# Patient Record
Sex: Female | Born: 1990 | Race: Black or African American | Hispanic: No | Marital: Married | Smoking: Former smoker
Health system: Southern US, Community
[De-identification: ages and names within clinical notes are randomized; demographics above are authoritative.]

## PROBLEM LIST (undated history)

## (undated) ENCOUNTER — Inpatient Hospital Stay (HOSPITAL_COMMUNITY): Payer: Self-pay

## (undated) DIAGNOSIS — D573 Sickle-cell trait: Secondary | ICD-10-CM

## (undated) DIAGNOSIS — O479 False labor, unspecified: Secondary | ICD-10-CM

## (undated) DIAGNOSIS — L03317 Cellulitis of buttock: Secondary | ICD-10-CM

## (undated) DIAGNOSIS — L0231 Cutaneous abscess of buttock: Secondary | ICD-10-CM

## (undated) DIAGNOSIS — L0591 Pilonidal cyst without abscess: Secondary | ICD-10-CM

## (undated) DIAGNOSIS — D649 Anemia, unspecified: Secondary | ICD-10-CM

## (undated) HISTORY — DX: Sickle-cell trait: D57.3

## (undated) HISTORY — DX: Cellulitis of buttock: L03.317

## (undated) HISTORY — DX: Pilonidal cyst without abscess: L05.91

## (undated) HISTORY — DX: Anemia, unspecified: D64.9

## (undated) HISTORY — PX: NO PAST SURGERIES: SHX2092

## (undated) HISTORY — DX: Cutaneous abscess of buttock: L02.31

---

## 2013-03-12 ENCOUNTER — Encounter (INDEPENDENT_AMBULATORY_CARE_PROVIDER_SITE_OTHER): Payer: Self-pay | Admitting: Surgery

## 2013-03-12 ENCOUNTER — Encounter (INDEPENDENT_AMBULATORY_CARE_PROVIDER_SITE_OTHER): Payer: Self-pay | Admitting: General Surgery

## 2013-03-12 ENCOUNTER — Other Ambulatory Visit: Payer: Self-pay | Admitting: Family Medicine

## 2013-03-12 ENCOUNTER — Other Ambulatory Visit (HOSPITAL_COMMUNITY)
Admission: RE | Admit: 2013-03-12 | Discharge: 2013-03-12 | Disposition: A | Discharge status: Home / Self Care | Payer: BC Managed Care – PPO | Source: Ambulatory Visit | Attending: Family Medicine | Admitting: Family Medicine

## 2013-03-12 ENCOUNTER — Ambulatory Visit (INDEPENDENT_AMBULATORY_CARE_PROVIDER_SITE_OTHER): Payer: BC Managed Care – PPO | Admitting: Surgery

## 2013-03-12 VITALS — BP 128/72 | HR 98 | Temp 98.0°F | Resp 18 | Ht 64.25 in | Wt 193.0 lb

## 2013-03-12 DIAGNOSIS — L0231 Cutaneous abscess of buttock: Secondary | ICD-10-CM

## 2013-03-12 DIAGNOSIS — Z124 Encounter for screening for malignant neoplasm of cervix: Secondary | ICD-10-CM | POA: Insufficient documentation

## 2013-03-12 DIAGNOSIS — L03317 Cellulitis of buttock: Secondary | ICD-10-CM

## 2013-03-12 HISTORY — DX: Cutaneous abscess of buttock: L03.317

## 2013-03-12 HISTORY — DX: Cutaneous abscess of buttock: L02.31

## 2013-03-12 MED ORDER — HYDROCODONE-ACETAMINOPHEN 5-325 MG PO TABS: 1.0000 | ORAL_TABLET | ORAL | Status: DC | PRN

## 2013-03-12 NOTE — Progress Notes (Signed)
Patient ID: Mckenzie Nelson, female   DOB: [birth year], 22 y.o.   MRN: [id number]  Chief Complaint  Patient presents with  . rt buttock abscess    HPI Mckenzie Nelson is a 22 y.o. female.  Referred by Dr. Maurice Small [facility] for evaluation of right buttock abscess  HPI This is a 22 year old female who presents with a five-year history of a mass in her right buttock. Occasionally it opens up and drains some purulent fluid. Sometimes it causes pain. Over the last couple of weeks she has had increasing tenderness and swelling as well as bloody drainage from this area. She was evaluated in her primary care physician's office today and is now referred for urgent office for evaluation. The patient has never had any surgery in this area.  History reviewed. No pertinent past medical history.  History reviewed. No pertinent past surgical history.  History reviewed. No pertinent family history.  Social History History  Substance Use Topics  . Smoking status: Former Games developer  . Smokeless tobacco: Not on file  . Alcohol Use: No    No Known Allergies  Current Outpatient Prescriptions  Medication Sig Dispense Refill  . HYDROcodone-acetaminophen (NORCO/VICODIN) 5-325 MG per tablet Take 1 tablet by mouth every 4 (four) hours as needed.  40 tablet  0   No current facility-administered medications for this visit.    Review of Systems Review of Systems  Constitutional: Negative for fever, chills and unexpected weight change.  HENT: Negative for congestion, hearing loss, sore throat, trouble swallowing and voice change.   Eyes: Negative for visual disturbance.  Respiratory: Negative for cough and wheezing.   Cardiovascular: Negative for chest pain, palpitations and leg swelling.  Gastrointestinal: Negative for nausea, vomiting, abdominal pain, diarrhea, constipation, blood in stool, abdominal distention and anal bleeding.  Genitourinary: Negative for hematuria, vaginal bleeding and difficulty  urinating.  Musculoskeletal: Negative for arthralgias.  Skin: Positive for wound. Negative for rash.  Neurological: Negative for seizures, syncope and headaches.  Hematological: Negative for adenopathy. Does not bruise/bleed easily.  Psychiatric/Behavioral: Negative for confusion.    Blood pressure 128/72, pulse 98, temperature 98 F (36.7 C), resp. rate 18, height 5' 4.25" (1.632 m), weight 193 lb (87.544 kg).    Physical Exam Physical Exam WDWN in NAD On her right buttocks medially there is a 2 cm area of hard induration. There is no overlying erythema. There is a small punctate opening that is draining some purulent fluid. 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 We prepped this area with Betadine and anesthetized with 1% lidocaine. I made a elliptical incision around the old drainage tract. We dissected down into a 2 cm diameter abscess cavity. There some old granulation tissue within this cavity. We packed the wound with quarter-inch Nu Gauze. The patient will come back later this week for dressing change in the arch in the office. I did give her one prescription for Vicodin for pain.  Mckenzie Arms. Corliss Skains, MD, [facility]  General/ Trauma Surgery  03/12/2013 4:54 PM         Zacheriah Stumpe K. 03/12/2013, 4:48 PM

## 2013-03-15 ENCOUNTER — Encounter (INDEPENDENT_AMBULATORY_CARE_PROVIDER_SITE_OTHER): Payer: Self-pay | Admitting: General Surgery

## 2013-03-15 ENCOUNTER — Ambulatory Visit (INDEPENDENT_AMBULATORY_CARE_PROVIDER_SITE_OTHER): Payer: BC Managed Care – PPO | Admitting: General Surgery

## 2013-03-15 VITALS — BP 128/86 | HR 68 | Temp 98.0°F | Resp 14 | Ht 64.2 in | Wt 193.0 lb

## 2013-03-15 DIAGNOSIS — Z09 Encounter for follow-up examination after completed treatment for conditions other than malignant neoplasm: Secondary | ICD-10-CM

## 2013-03-15 NOTE — Patient Instructions (Signed)
Expect some ongoing intermittent drainage. Wear a pad in her underwear or cover with dry gauze. May shower. Call for recurrent symptoms or if have additional questions

## 2013-03-15 NOTE — Progress Notes (Signed)
Subjective:     Patient ID: Mckenzie Nelson, female   DOB: 1991-02-09, 22 y.o.   MRN: [id number]  HPI 22 year old African American female came in 3 days ago with an infected chronic abscess of the medial right buttock. Dr. Corliss Skains did a small incision and drainage in the office and left packing. She states that she is doing well. She states that she is having to change the outer gauze about twice a day due to drainage. She denies any fevers or chills. She denies any difficulty urinating  Review of Systems     Objective:   Physical Exam BP 128/86  Pulse 68  Temp(Src) 98 F (36.7 C)  Resp 14  Ht 5' 4.2" (1.631 m)  Wt 193 lb (87.544 kg)  BMI 32.91 kg/m2 Alert, no apparent distress Buttocks - upper medial portion of right buttock demonstrates a 1-1/2 cm opening with a small amount of packing. There is no saline is. I cannot express any drainage. Packing strip was removed. There is about a dime-sized amount of induration medial toward the gluteal cleft. In the gluteal cleft there appears to be a small sinus may be consistent with a pilonidal sinus    Assessment:     Status post incision and drainage of the chronic right medial buttock abscess     Plan:     The area appears to be healing well. There is only some minimal induration left. I told the patient she no longer need to pack the wound. I told her to expect some ongoing intermittent drainage. I explained that the residual induration should continue to improve. I recommended that once the area has completely healed that she consider keeping the gluteal cleft free of hair in case this is a pilonidal sinus. F/u PRN  Mary Sella. Andrey Campanile, MD, FACS General, Bariatric, & Minimally Invasive Surgery [facility], [place]

## 2013-04-15 ENCOUNTER — Other Ambulatory Visit: Payer: Self-pay | Admitting: Family Medicine

## 2013-04-15 ENCOUNTER — Other Ambulatory Visit (HOSPITAL_COMMUNITY)
Admission: RE | Admit: 2013-04-15 | Discharge: 2013-04-15 | Disposition: A | Discharge status: Home / Self Care | Payer: BC Managed Care – PPO | Source: Ambulatory Visit | Attending: Family Medicine | Admitting: Family Medicine

## 2013-04-15 DIAGNOSIS — R87615 Unsatisfactory cytologic smear of cervix: Secondary | ICD-10-CM | POA: Insufficient documentation

## 2013-05-15 ENCOUNTER — Emergency Department (INDEPENDENT_AMBULATORY_CARE_PROVIDER_SITE_OTHER)
Admission: EM | Admit: 2013-05-15 | Discharge: 2013-05-15 | Disposition: A | Discharge status: Home / Self Care | Payer: BC Managed Care – PPO | Source: Home / Self Care

## 2013-05-15 ENCOUNTER — Encounter (HOSPITAL_COMMUNITY): Payer: Self-pay | Admitting: Emergency Medicine

## 2013-05-15 DIAGNOSIS — J069 Acute upper respiratory infection, unspecified: Secondary | ICD-10-CM

## 2013-05-15 DIAGNOSIS — R0982 Postnasal drip: Secondary | ICD-10-CM

## 2013-05-15 NOTE — Discharge Instructions (Signed)
Pharyngitis For drainage allegra or claritin. For stronger med that may cause drowsiness Chlor-Trimeton. Cepacol Lozenges Ibuprofen Pharyngitis is redness, pain, and swelling (inflammation) of your pharynx.  CAUSES  Pharyngitis is usually caused by infection. Most of the time, these infections are from viruses (viral) and are part of a cold. However, sometimes pharyngitis is caused by bacteria (bacterial). Pharyngitis can also be caused by allergies. Viral pharyngitis may be spread from person to person by coughing, sneezing, and personal items or utensils (cups, forks, spoons, toothbrushes). Bacterial pharyngitis may be spread from person to person by more intimate contact, such as kissing.  SIGNS AND SYMPTOMS  Symptoms of pharyngitis include:   Sore throat.   Tiredness (fatigue).   Low-grade fever.   Headache.  Joint pain and muscle aches.  Skin rashes.  Swollen lymph nodes.  Plaque-like film on throat or tonsils (often seen with bacterial pharyngitis). DIAGNOSIS  Your health care provider will ask you questions about your illness and your symptoms. Your medical history, along with a physical exam, is often all that is needed to diagnose pharyngitis. Sometimes, a rapid strep test is done. Other lab tests may also be done, depending on the suspected cause.  TREATMENT  Viral pharyngitis will usually get better in 3 4 days without the use of medicine. Bacterial pharyngitis is treated with medicines that kill germs (antibiotics).  HOME CARE INSTRUCTIONS   Drink enough water and fluids to keep your urine clear or pale yellow.   Only take over-the-counter or prescription medicines as directed by your health care provider:   If you are prescribed antibiotics, make sure you finish them even if you start to feel better.   Do not take aspirin.   Get lots of rest.   Gargle with 8 oz of salt water ( tsp of salt per 1 qt of water) as often as every 1 2 hours to soothe your  throat.   Throat lozenges (if you are not at risk for choking) or sprays may be used to soothe your throat. SEEK MEDICAL CARE IF:   You have large, tender lumps in your neck.  You have a rash.  You cough up green, yellow-brown, or bloody spit. SEEK IMMEDIATE MEDICAL CARE IF:   Your neck becomes stiff.  You drool or are unable to swallow liquids.  You vomit or are unable to keep medicines or liquids down.  You have severe pain that does not go away with the use of recommended medicines.  You have trouble breathing (not caused by a stuffy nose). MAKE SURE YOU:   Understand these instructions.  Will watch your condition.  Will get help right away if you are not doing well or get worse. Document Released: 03/14/2005 Document Revised: 01/02/2013 Document Reviewed: 11/19/2012 [facility] Patient Information 2014 [place].  Upper Respiratory Infection, Adult An upper respiratory infection (URI) is also sometimes known as the common cold. The upper respiratory tract includes the nose, sinuses, throat, trachea, and bronchi. Bronchi are the airways leading to the lungs. Most people improve within 1 week, but symptoms can last up to 2 weeks. A residual cough may last even longer.  CAUSES Many different viruses can infect the tissues lining the upper respiratory tract. The tissues become irritated and inflamed and often become very moist. Mucus production is also common. A cold is contagious. You can easily spread the virus to others by oral contact. This includes kissing, sharing a glass, coughing, or sneezing. Touching your mouth or nose and then touching  a surface, which is then touched by another person, can also spread the virus. SYMPTOMS  Symptoms typically develop 1 to 3 days after you come in contact with a cold virus. Symptoms vary from person to person. They may include:  Runny nose.  Sneezing.  Nasal congestion.  Sinus irritation.  Sore throat.  Loss of voice  (laryngitis).  Cough.  Fatigue.  Muscle aches.  Loss of appetite.  Headache.  Low-grade fever. DIAGNOSIS  You might diagnose your own cold based on familiar symptoms, since most people get a cold 2 to 3 times a year. Your caregiver can confirm this based on your exam. Most importantly, your caregiver can check that your symptoms are not due to another disease such as strep throat, sinusitis, pneumonia, asthma, or epiglottitis. Blood tests, throat tests, and X-rays are not necessary to diagnose a common cold, but they may sometimes be helpful in excluding other more serious diseases. Your caregiver will decide if any further tests are required. RISKS AND COMPLICATIONS  You may be at risk for a more severe case of the common cold if you smoke cigarettes, have chronic heart disease (such as heart failure) or lung disease (such as asthma), or if you have a weakened immune system. The very young and very old are also at risk for more serious infections. Bacterial sinusitis, middle ear infections, and bacterial pneumonia can complicate the common cold. The common cold can worsen asthma and chronic obstructive pulmonary disease (COPD). Sometimes, these complications can require emergency medical care and may be life-threatening. PREVENTION  The best way to protect against getting a cold is to practice good hygiene. Avoid oral or hand contact with people with cold symptoms. Wash your hands often if contact occurs. There is no clear evidence that vitamin C, vitamin E, echinacea, or exercise reduces the chance of developing a cold. However, it is always recommended to get plenty of rest and practice good nutrition. TREATMENT  Treatment is directed at relieving symptoms. There is no cure. Antibiotics are not effective, because the infection is caused by a virus, not by bacteria. Treatment may include:  Increased fluid intake. Sports drinks offer valuable electrolytes, sugars, and fluids.  Breathing  heated mist or steam (vaporizer or shower).  Eating chicken soup or other clear broths, and maintaining good nutrition.  Getting plenty of rest.  Using gargles or lozenges for comfort.  Controlling fevers with ibuprofen or acetaminophen as directed by your caregiver.  Increasing usage of your inhaler if you have asthma. Zinc gel and zinc lozenges, taken in the first 24 hours of the common cold, can shorten the duration and lessen the severity of symptoms. Pain medicines may help with fever, muscle aches, and throat pain. A variety of non-prescription medicines are available to treat congestion and runny nose. Your caregiver can make recommendations and may suggest nasal or lung inhalers for other symptoms.  HOME CARE INSTRUCTIONS   Only take over-the-counter or prescription medicines for pain, discomfort, or fever as directed by your caregiver.  Use a warm mist humidifier or inhale steam from a shower to increase air moisture. This may keep secretions moist and make it easier to breathe.  Drink enough water and fluids to keep your urine clear or pale yellow.  Rest as needed.  Return to work when your temperature has returned to normal or as your caregiver advises. You may need to stay home longer to avoid infecting others. You can also use a face mask and careful hand washing to  prevent spread of the virus. SEEK MEDICAL CARE IF:   After the first few days, you feel you are getting worse rather than better.  You need your caregiver's advice about medicines to control symptoms.  You develop chills, worsening shortness of breath, or brown or red sputum. These may be signs of pneumonia.  You develop yellow or brown nasal discharge or pain in the face, especially when you bend forward. These may be signs of sinusitis.  You develop a fever, swollen neck glands, pain with swallowing, or white areas in the back of your throat. These may be signs of strep throat. SEEK IMMEDIATE MEDICAL CARE  IF:   You have a fever.  You develop severe or persistent headache, ear pain, sinus pain, or chest pain.  You develop wheezing, a prolonged cough, cough up blood, or have a change in your usual mucus (if you have chronic lung disease).  You develop sore muscles or a stiff neck. Document Released: 09/07/2000 Document Revised: 06/06/2011 Document Reviewed: 07/16/2010 [facility] Patient Information 2014 [place], [place].

## 2013-05-15 NOTE — ED Provider Notes (Signed)
CSN: [id number]     Arrival date & time 05/15/13  1303 History   First MD Initiated Contact with Patient 05/15/13 1547     Chief Complaint  Patient presents with  . URI     (Consider location/radiation/quality/duration/timing/severity/associated sxs/prior Treatment) HPI Comments: 23 year old female complaining of one-week history of sore throat, PND, cough in the morning with fluid.   History reviewed. No pertinent past medical history. History reviewed. No pertinent past surgical history. No family history on file. History  Substance Use Topics  . Smoking status: Former Research scientist (life sciences)  . Smokeless tobacco: Not on file  . Alcohol Use: No   OB History   Grav Para Term Preterm Abortions TAB SAB Ect Mult Living                 Review of Systems  Constitutional: Negative for fever, chills, activity change, appetite change and fatigue.  HENT: Positive for congestion, postnasal drip, rhinorrhea and sore throat. Negative for facial swelling.   Eyes: Negative.   Respiratory: Positive for cough.   Cardiovascular: Negative.   Gastrointestinal: Negative.   Musculoskeletal: Negative for neck pain and neck stiffness.  Skin: Negative for pallor and rash.  Neurological: Negative.       Allergies  Review of patient's allergies indicates no known allergies.  Home Medications   Current Outpatient Rx  Name  Route  Sig  Dispense  Refill  . Phenylephrine-APAP-Guaifenesin (MUCINEX SINUS-MAX PO)   Oral   Take by mouth.         Marland Kitchen HYDROcodone-acetaminophen (NORCO/VICODIN) 5-325 MG per tablet   Oral   Take 1 tablet by mouth every 4 (four) hours as needed.   40 tablet   0    BP 142/89  Pulse 73  Temp(Src) 97.9 F (36.6 C) (Oral)  Resp 18  SpO2 98%  LMP 05/04/2013 Physical Exam  Nursing note and vitals reviewed. Constitutional: She is oriented to person, place, and time. She appears well-developed and well-nourished. No distress.  HENT:  Mouth/Throat: No oropharyngeal exudate.   Vicodin is normal, left TM is obscured by cerumen. Oropharynx is erythematous with dense cobblestoning and clear PND.  Eyes: Conjunctivae and EOM are normal.  Neck: Normal range of motion. Neck supple.  Cardiovascular: Normal rate, regular rhythm and normal heart sounds.   Pulmonary/Chest: Effort normal and breath sounds normal. No respiratory distress. She has no wheezes.  Musculoskeletal: Normal range of motion. She exhibits no edema.  Lymphadenopathy:    She has no cervical adenopathy.  Neurological: She is alert and oriented to person, place, and time.  Skin: Skin is warm and dry. No rash noted.  Psychiatric: She has a normal mood and affect.    ED Course  Procedures (including critical care time) Labs Review Labs Reviewed - No data to display Imaging Review No results found.    MDM   Final diagnoses:  PND (post-nasal drip)  URI (upper respiratory infection)      For drainage which is causing most of the symptoms of sore throat and congestion take Claritin or Allegra. For stronger medicine that may cause drowsiness may take Chlor-Trimeton.  Lozenges for sore throat pain and ibuprofen.   Janne Napoleon, NP 05/15/13 [phone]

## 2013-05-15 NOTE — ED Notes (Signed)
Mckenzie Nelson, onset last week

## 2013-05-15 NOTE — ED Provider Notes (Signed)
Medical screening examination/treatment/procedure(s) were performed by non-physician practitioner and as supervising physician I was immediately available for consultation/collaboration.  Philipp Deputy, M.D.  Harden Mo, MD 05/15/13 [phone]

## 2013-07-16 ENCOUNTER — Encounter (INDEPENDENT_AMBULATORY_CARE_PROVIDER_SITE_OTHER): Payer: BC Managed Care – PPO | Admitting: Surgery

## 2013-11-16 ENCOUNTER — Encounter (HOSPITAL_COMMUNITY): Payer: Self-pay | Admitting: Emergency Medicine

## 2013-11-16 ENCOUNTER — Emergency Department (HOSPITAL_COMMUNITY)
Admission: EM | Admit: 2013-11-16 | Discharge: 2013-11-16 | Disposition: A | Discharge status: Home / Self Care | Payer: BC Managed Care – PPO | Attending: Emergency Medicine | Admitting: Emergency Medicine

## 2013-11-16 DIAGNOSIS — Z87891 Personal history of nicotine dependence: Secondary | ICD-10-CM | POA: Insufficient documentation

## 2013-11-16 DIAGNOSIS — S336XXA Sprain of sacroiliac joint, initial encounter: Secondary | ICD-10-CM | POA: Diagnosis not present

## 2013-11-16 DIAGNOSIS — Y9289 Other specified places as the place of occurrence of the external cause: Secondary | ICD-10-CM | POA: Diagnosis not present

## 2013-11-16 DIAGNOSIS — M545 Low back pain, unspecified: Secondary | ICD-10-CM

## 2013-11-16 DIAGNOSIS — X500XXA Overexertion from strenuous movement or load, initial encounter: Secondary | ICD-10-CM | POA: Diagnosis not present

## 2013-11-16 DIAGNOSIS — IMO0002 Reserved for concepts with insufficient information to code with codable children: Secondary | ICD-10-CM | POA: Insufficient documentation

## 2013-11-16 DIAGNOSIS — S39012A Strain of muscle, fascia and tendon of lower back, initial encounter: Secondary | ICD-10-CM

## 2013-11-16 DIAGNOSIS — S335XXA Sprain of ligaments of lumbar spine, initial encounter: Secondary | ICD-10-CM | POA: Diagnosis not present

## 2013-11-16 DIAGNOSIS — Y9389 Activity, other specified: Secondary | ICD-10-CM | POA: Insufficient documentation

## 2013-11-16 MED ORDER — HYDROCODONE-ACETAMINOPHEN 5-325 MG PO TABS: 1.0000 | ORAL_TABLET | Freq: Four times a day (QID) | ORAL | Status: DC | PRN

## 2013-11-16 MED ORDER — METHOCARBAMOL 500 MG PO TABS: 500.0000 mg | ORAL_TABLET | Freq: Two times a day (BID) | ORAL | Status: DC

## 2013-11-16 MED ORDER — METHOCARBAMOL 500 MG PO TABS
500.0000 mg | ORAL_TABLET | Freq: Once | ORAL | Status: AC
  Administered 2013-11-16: 500 mg via ORAL
  Filled 2013-11-16 (×2): qty 1

## 2013-11-16 MED ORDER — HYDROCODONE-ACETAMINOPHEN 5-325 MG PO TABS
2.0000 | ORAL_TABLET | Freq: Once | ORAL | Status: AC
  Administered 2013-11-16: 2 via ORAL
  Filled 2013-11-16: qty 2

## 2013-11-16 MED ORDER — NAPROXEN 500 MG PO TABS: 500.0000 mg | ORAL_TABLET | Freq: Two times a day (BID) | ORAL | Status: DC

## 2013-11-16 NOTE — ED Notes (Signed)
Patient declines offer for pain medication at this time. She is willing to use heat pack for comfort.

## 2013-11-16 NOTE — ED Notes (Signed)
In reviewing discharge prescriptions pt states she is unable to take Naproxen d/t stomach irritation.  PA advised, prescription given for Vicodin.

## 2013-11-16 NOTE — Discharge Instructions (Signed)
1. Medications: robaxin, naproxyn,  usual home medications 2. Treatment: rest, drink plenty of fluids, gentle stretching as discussed, alternate ice and heat 3. Follow Up: Please followup with your primary doctor for discussion of your diagnoses and further evaluation after today's visit; if you do not have a primary care doctor use the resource guide provided to find one;   Back Exercises Back exercises help treat and prevent back injuries. The goal of back exercises is to increase the strength of your abdominal and back muscles and the flexibility of your back. These exercises should be started when you no longer have back pain. Back exercises include:  Pelvic Tilt. Lie on your back with your knees bent. Tilt your pelvis until the lower part of your back is against the floor. Hold this position 5 to 10 sec and repeat 5 to 10 times.  Knee to Chest. Pull first 1 knee up against your chest and hold for 20 to 30 seconds, repeat this with the other knee, and then both knees. This may be done with the other leg straight or bent, whichever feels better.  Sit-Ups or Curl-Ups. Bend your knees 90 degrees. Start with tilting your pelvis, and do a partial, slow sit-up, lifting your trunk only 30 to 45 degrees off the floor. Take at least 2 to 3 seconds for each sit-up. Do not do sit-ups with your knees out straight. If partial sit-ups are difficult, simply do the above but with only tightening your abdominal muscles and holding it as directed.  Hip-Lift. Lie on your back with your knees flexed 90 degrees. Push down with your feet and shoulders as you raise your hips a couple inches off the floor; hold for 10 seconds, repeat 5 to 10 times.  Back arches. Lie on your stomach, propping yourself up on bent elbows. Slowly press on your hands, causing an arch in your low back. Repeat 3 to 5 times. Any initial stiffness and discomfort should lessen with repetition over time.  Shoulder-Lifts. Lie face down with arms  beside your body. Keep hips and torso pressed to floor as you slowly lift your head and shoulders off the floor. Do not overdo your exercises, especially in the beginning. Exercises may cause you some mild back discomfort which lasts for a few minutes; however, if the pain is more severe, or lasts for more than 15 minutes, do not continue exercises until you see your caregiver. Improvement with exercise therapy for back problems is slow.  See your caregivers for assistance with developing a proper back exercise program. Document Released: 04/21/2004 Document Revised: 06/06/2011 Document Reviewed: 01/13/2011 [facility] Patient Information 2015 [place], [place]. This information is not intended to replace advice given to you by your health care provider. Make sure you discuss any questions you have with your health care provider.

## 2013-11-16 NOTE — ED Provider Notes (Signed)
CSN: [id number]     Arrival date & time 11/16/13  1849 History  This chart was scribed for non-physician practitioner working with Sharyon Cable, MD, by Jeanell Sparrow, ED Scribe. This patient was seen in room TR07C/TR07C and the patient's care was started at 7:59 PM.   Chief Complaint  Patient presents with  . Back Pain   Patient is a 23 y.o. female presenting with back pain. The history is provided by the patient and medical records. No language interpreter was used.  Back Pain Associated symptoms: no abdominal pain, no chest pain, no dysuria, no fever, no headaches, no numbness and no weakness    HPI Comments: Mckenzie Nelson is a 23 y.o. female who presents to the Emergency Department complaining of constant moderate lower back pain that started last night. Pt states that she was dancing last night and had a spasm after bending down.  She states that she took ibuprofen with minimal relief. She reports heating affected area with some relief. She denies any hx of back problems or surgeries. She reports no use of medication on a daily basis. She denies any hx of cancer, use of blood thinners, blood clots, or IV drug use. She also denies any loss of control of bowels or bladder, or any numbness or tingling in the groin area, weakness in the legs, dysuria, hematuria, vaginal discharge.  History reviewed. No pertinent past medical history. History reviewed. No pertinent past surgical history. No family history on file. History  Substance Use Topics  . Smoking status: Former Research scientist (life sciences)  . Smokeless tobacco: Not on file  . Alcohol Use: No   OB History   Grav Para Term Preterm Abortions TAB SAB Ect Mult Living                 Review of Systems  Constitutional: Negative for fever and fatigue.  Respiratory: Negative for chest tightness and shortness of breath.   Cardiovascular: Negative for chest pain.  Gastrointestinal: Negative for nausea, vomiting, abdominal pain and diarrhea.   Genitourinary: Negative for dysuria, urgency, frequency and hematuria.  Musculoskeletal: Positive for back pain. Negative for gait problem, joint swelling, neck pain and neck stiffness.  Skin: Negative for rash.  Neurological: Negative for weakness, light-headedness, numbness and headaches.  All other systems reviewed and are negative.  Allergies  Review of patient's allergies indicates no known allergies.  Home Medications   Prior to Admission medications   Medication Sig Start Date End Date Taking? Authorizing Provider  HYDROcodone-acetaminophen (NORCO/VICODIN) 5-325 MG per tablet Take 1 tablet by mouth every 6 (six) hours as needed for moderate pain or severe pain. 11/16/13   Makiyah Zentz, PA-C  methocarbamol (ROBAXIN) 500 MG tablet Take 1 tablet (500 mg total) by mouth 2 (two) times daily. 11/16/13   Yolonda Purtle, PA-C  naproxen (NAPROSYN) 500 MG tablet Take 1 tablet (500 mg total) by mouth 2 (two) times daily with a meal. 11/16/13   Kimari Coudriet, PA-C   BP 138/83  Pulse 82  Temp(Src) 98 F (36.7 C) (Oral)  Resp 18  Ht 5\' 4"  (1.626 m)  Wt 198 lb (89.812 kg)  BMI 33.97 kg/m2  SpO2 100%  LMP 10/26/2013 Physical Exam  Nursing note and vitals reviewed. Constitutional: She appears well-developed and well-nourished. No distress.  HENT:  Head: Normocephalic and atraumatic.  Mouth/Throat: Oropharynx is clear and moist. No oropharyngeal exudate.  Eyes: Conjunctivae are normal.  Neck: Normal range of motion. Neck supple.  Full ROM without pain  Cardiovascular:  Normal rate, regular rhythm, normal heart sounds and intact distal pulses.   No murmur heard. Pulmonary/Chest: Effort normal and breath sounds normal. No respiratory distress. She has no wheezes.  Abdominal: Soft. Bowel sounds are normal. She exhibits no distension. There is no tenderness.  Musculoskeletal:  Full range of motion of the T-spine and L-spine No tenderness to palpation of the spinous  processes of the T-spine or L-spine Mild tenderness to palpation of the bilateral paraspinous muscles of the L-spine  Lymphadenopathy:    She has no cervical adenopathy.  Neurological: She is alert. She has normal reflexes.  Speech is clear and goal oriented, follows commands Normal 5/5 strength in upper and lower extremities bilaterally including dorsiflexion and plantar flexion, strong and equal grip strength Sensation normal to light and sharp touch Moves extremities without ataxia, coordination intact Normal gait Normal balance   Skin: Skin is warm and dry. No rash noted. She is not diaphoretic. No erythema.  Psychiatric: She has a normal mood and affect. Her behavior is normal.    ED Course  Procedures (including critical care time) DIAGNOSTIC STUDIES: Oxygen Saturation is 100% on RA, normal by my interpretation.    COORDINATION OF CARE: 8:03 PM- Pt advised of plan for treatment and pt agrees.  Labs Review Labs Reviewed - No data to display  Imaging Review No results found.   EKG Interpretation None      MDM   Final diagnoses:  Bilateral low back pain without sciatica  Lumbar strain, initial encounter   Mckenzie Nelson presents with low back pain after cleaning the house.  Patient with back pain.  No neurological deficits and normal neuro exam.  Patient can walk without difficulty or gait disturbance.  No loss of bowel or bladder control.  No concern for cauda equina.  No fever, night sweats, weight loss, h/o cancer, IVDU.  RICE protocol and pain medicine indicated and discussed with patient.   I have personally reviewed patient's vitals, nursing note and any pertinent labs or imaging.  I performed an undressed physical exam.    At this time, it has been determined that no acute conditions requiring further emergency intervention. The patient/guardian have been advised of the diagnosis and plan. I reviewed all labs and imaging including any potential incidental  findings. We have discussed signs and symptoms that warrant return to the ED, such as high fevers, loss of bowel or bladder control, gait disturbance.  Patient/guardian has voiced understanding and agreed to follow-up with the PCP or specialist in 3 days.  Vital signs are stable at discharge.   BP 138/83  Pulse 82  Temp(Src) 98 F (36.7 C) (Oral)  Resp 18  Ht 5\' 4"  (1.626 m)  Wt 198 lb (89.812 kg)  BMI 33.97 kg/m2  SpO2 100%  LMP 10/26/2013   I personally performed the services described in this documentation, which was scribed in my presence. The recorded information has been reviewed and is accurate.       Jarrett Soho Jacobi Ryant, PA-C 11/16/13 2032

## 2013-11-16 NOTE — ED Provider Notes (Signed)
Medical screening examination/treatment/procedure(s) were performed by non-physician practitioner and as supervising physician I was immediately available for consultation/collaboration.   EKG Interpretation None        Sharyon Cable, MD 11/16/13 2300

## 2013-11-16 NOTE — ED Notes (Signed)
Jarrett Soho, PA-C, is at the bedside.

## 2013-11-16 NOTE — ED Notes (Signed)
Patient states that her lower back started hurting last night, patient states she was dancing last night and then had a spasm after bending down.

## 2014-10-14 ENCOUNTER — Emergency Department (HOSPITAL_COMMUNITY)
Admission: EM | Admit: 2014-10-14 | Discharge: 2014-10-14 | Disposition: A | Discharge status: Home / Self Care | Payer: BLUE CROSS/BLUE SHIELD | Attending: Emergency Medicine | Admitting: Emergency Medicine

## 2014-10-14 ENCOUNTER — Encounter (HOSPITAL_COMMUNITY): Payer: Self-pay | Admitting: Physical Medicine and Rehabilitation

## 2014-10-14 DIAGNOSIS — Z791 Long term (current) use of non-steroidal anti-inflammatories (NSAID): Secondary | ICD-10-CM | POA: Diagnosis not present

## 2014-10-14 DIAGNOSIS — L0231 Cutaneous abscess of buttock: Secondary | ICD-10-CM | POA: Insufficient documentation

## 2014-10-14 DIAGNOSIS — Z79899 Other long term (current) drug therapy: Secondary | ICD-10-CM | POA: Insufficient documentation

## 2014-10-14 DIAGNOSIS — Z87891 Personal history of nicotine dependence: Secondary | ICD-10-CM | POA: Diagnosis not present

## 2014-10-14 DIAGNOSIS — L0291 Cutaneous abscess, unspecified: Secondary | ICD-10-CM

## 2014-10-14 MED ORDER — LIDOCAINE-EPINEPHRINE (PF) 2 %-1:200000 IJ SOLN
10.0000 mL | Freq: Once | INTRAMUSCULAR | Status: AC
  Administered 2014-10-14: 10 mL
  Filled 2014-10-14: qty 20

## 2014-10-14 NOTE — ED Notes (Signed)
Pt presents to department for evaluation of possible abscess to buttock. Ongoing x1 year. Pt reports increased pain today.

## 2014-10-14 NOTE — Discharge Instructions (Signed)
Please follow-up with your PCP or emergency department for wound reevaluation in 3 days. Return sooner if you begin to experience fevers, chills, increased redness, swelling or tenderness to the area.  Abscess An abscess is an infected area that contains a collection of pus and debris.It can occur in almost any part of the body. An abscess is also known as a furuncle or boil. CAUSES  An abscess occurs when tissue gets infected. This can occur from blockage of oil or sweat glands, infection of hair follicles, or a minor injury to the skin. As the body tries to fight the infection, pus collects in the area and creates pressure under the skin. This pressure causes pain. People with weakened immune systems have difficulty fighting infections and get certain abscesses more often.  SYMPTOMS Usually an abscess develops on the skin and becomes a painful mass that is red, warm, and tender. If the abscess forms under the skin, you may feel a moveable soft area under the skin. Some abscesses break open (rupture) on their own, but most will continue to get worse without care. The infection can spread deeper into the body and eventually into the bloodstream, causing you to feel ill.  DIAGNOSIS  Your caregiver will take your medical history and perform a physical exam. A sample of fluid may also be taken from the abscess to determine what is causing your infection. TREATMENT  Your caregiver may prescribe antibiotic medicines to fight the infection. However, taking antibiotics alone usually does not cure an abscess. Your caregiver may need to make a small cut (incision) in the abscess to drain the pus. In some cases, gauze is packed into the abscess to reduce pain and to continue draining the area. HOME CARE INSTRUCTIONS   Only take over-the-counter or prescription medicines for pain, discomfort, or fever as directed by your caregiver.  If you were prescribed antibiotics, take them as directed. Finish them even if  you start to feel better.  If gauze is used, follow your caregiver's directions for changing the gauze.  To avoid spreading the infection:  Keep your draining abscess covered with a bandage.  Wash your hands well.  Do not share personal care items, towels, or whirlpools with others.  Avoid skin contact with others.  Keep your skin and clothes clean around the abscess.  Keep all follow-up appointments as directed by your caregiver. SEEK MEDICAL CARE IF:   You have increased pain, swelling, redness, fluid drainage, or bleeding.  You have muscle aches, chills, or a general ill feeling.  You have a fever. MAKE SURE YOU:   Understand these instructions.  Will watch your condition.  Will get help right away if you are not doing well or get worse. Document Released: 12/22/2004 Document Revised: 09/13/2011 Document Reviewed: 05/27/2011 [facility] Patient Information 2015 [place], [place]. This information is not intended to replace advice given to you by your health care provider. Make sure you discuss any questions you have with your health care provider.  Abscess Care After An abscess (also called a boil or furuncle) is an infected area that contains a collection of pus. Signs and symptoms of an abscess include pain, tenderness, redness, or hardness, or you may feel a moveable soft area under your skin. An abscess can occur anywhere in the body. The infection may spread to surrounding tissues causing cellulitis. A cut (incision) by the surgeon was made over your abscess and the pus was drained out. Gauze may have been packed into the space to  provide a drain that will allow the cavity to heal from the inside outwards. The boil may be painful for 5 to 7 days. Most people with a boil do not have high fevers. Your abscess, if seen early, may not have localized, and may not have been lanced. If not, another appointment may be required for this if it does not get better on its own or with  medications. HOME CARE INSTRUCTIONS   Only take over-the-counter or prescription medicines for pain, discomfort, or fever as directed by your caregiver.  When you bathe, soak and then remove gauze or iodoform packs at least daily or as directed by your caregiver. You may then wash the wound gently with mild soapy water. Repack with gauze or do as your caregiver directs. SEEK IMMEDIATE MEDICAL CARE IF:   You develop increased pain, swelling, redness, drainage, or bleeding in the wound site.  You develop signs of generalized infection including muscle aches, chills, fever, or a general ill feeling.  An oral temperature above 102 F (38.9 C) develops, not controlled by medication. See your caregiver for a recheck if you develop any of the symptoms described above. If medications (antibiotics) were prescribed, take them as directed. Document Released: 09/30/2004 Document Revised: 06/06/2011 Document Reviewed: 05/28/2007 [facility] Patient Information 2015 [place], [place]. This information is not intended to replace advice given to you by your health care provider. Make sure you discuss any questions you have with your health care provider.

## 2014-10-14 NOTE — ED Provider Notes (Signed)
CSN: [id number]     Arrival date & time 10/14/14  1459 History  This chart was scribed for Comer Locket, PA-C, working with Pattricia Boss, MD by Steva Colder, ED Scribe. The patient was seen in room TR11C/TR11C at 4:16 PM.    Chief Complaint  Patient presents with  . Abscess      The history is provided by the patient. No language interpreter was used.    HPI Comments: Mckenzie Nelson is a 24 y.o. female who presents to the Emergency Department complaining of abscess to the buttocks onset 1 year. Pt reports that her abscess pain has worsened since Tuesday. Pt rates the abscess pain as 8/10. No interventions tried for current problem. Pt has had the area I&D in the past by [facility] and was informed that it was a pilonidal cyst. She states that she is having associated symptoms of drainage of pus and blood. Pt denies abdominal pain, diarrhea, constipation, fever, chills, hematuria, dysuria, rectal pain, and any other symptoms.    History reviewed. No pertinent past medical history. History reviewed. No pertinent past surgical history. No family history on file. History  Substance Use Topics  . Smoking status: Former Research scientist (life sciences)  . Smokeless tobacco: Not on file  . Alcohol Use: No   OB History    No data available     Review of Systems  Constitutional: Negative for fever.  Gastrointestinal: Negative for abdominal pain, diarrhea, constipation and rectal pain.  Genitourinary: Negative for dysuria, hematuria and difficulty urinating.  Skin:       Abscess to buttock      Allergies  Review of patient's allergies indicates no known allergies.  Home Medications   Prior to Admission medications   Medication Sig Start Date End Date Taking? Authorizing Provider  HYDROcodone-acetaminophen (NORCO/VICODIN) 5-325 MG per tablet Take 1 tablet by mouth every 6 (six) hours as needed for moderate pain or severe pain. 11/16/13   Hannah Muthersbaugh, PA-C  methocarbamol (ROBAXIN) 500 MG  tablet Take 1 tablet (500 mg total) by mouth 2 (two) times daily. 11/16/13   Hannah Muthersbaugh, PA-C  naproxen (NAPROSYN) 500 MG tablet Take 1 tablet (500 mg total) by mouth 2 (two) times daily with a meal. 11/16/13   Hannah Muthersbaugh, PA-C   BP 139/82 mmHg  Pulse 64  Temp(Src) 98.6 F (37 C) (Oral)  Resp 18  SpO2 100% Physical Exam  Constitutional: She is oriented to person, place, and time. She appears well-developed and well-nourished. No distress.  HENT:  Head: Normocephalic and atraumatic.  Eyes: EOM are normal.  Neck: Neck supple. No tracheal deviation present.  Cardiovascular: Normal rate, regular rhythm and normal heart sounds.  Exam reveals no gallop and no friction rub.   No murmur heard. Pulmonary/Chest: Effort normal and breath sounds normal. No respiratory distress. She has no wheezes. She has no rales.  Genitourinary:  Chaperone present for entirety of genitourinary exam. There is draining lesion noted to apex of right gluteal cleft, approximately 3 cm in diameter with surrounding induration. No surrounding cellulitis noted. No evidence of perirectal involvement.  Musculoskeletal: Normal range of motion.  Neurological: She is alert and oriented to person, place, and time.  Skin: Skin is warm and dry.  Psychiatric: She has a normal mood and affect. Her behavior is normal.  Nursing note and vitals reviewed.   ED Course  Procedures (including critical care time) INCISION AND DRAINAGE Performed by: Verl Dicker Consent: Verbal consent obtained. Risks and benefits: risks, benefits and  alternatives were discussed Type: abscess  Body area: Left buttock  Anesthesia: local infiltration  Incision was made with a scalpel.  Local anesthetic: lidocaine 2  % with epinephrine  Anesthetic total: 2 ml  Complexity: complex Blunt dissection to break up loculations  Drainage: purulent  Drainage amount: Mild   Packing material: 1/4 in iodoform gauze  Patient  tolerance: Patient tolerated the procedure well with no immediate complications.    DIAGNOSTIC STUDIES: Oxygen Saturation is 100% on RA, nl by my interpretation.    COORDINATION OF CARE: 4:20 PM-Discussed treatment plan which includes I&D with pt at bedside and pt agreed to plan.   Labs Review Labs Reviewed - No data to display  Imaging Review No results found.   EKG Interpretation None     Meds given in ED:  Medications  lidocaine-EPINEPHrine (XYLOCAINE W/EPI) 2 %-1:200000 (PF) injection 10 mL (10 mLs Infiltration Given 10/14/14 1702)    New Prescriptions   No medications on file   Filed Vitals:   10/14/14 1533  BP: 139/82  Pulse: 64  Temp: 98.6 F (37 C)  TempSrc: Oral  Resp: 18  SpO2: 100%    MDM  Vitals stable - WNL -afebrile Pt resting comfortably in ED. PE--no evidence of perirectal or mucosal involvement. Abscess to upper left buttock without any evidence of cellulitis. Patient discussed that she will be able to follow-up with her general surgeon for further evaluation of possible fistula. No antibiotics indicated at this time. Patient given sterile dressing and bacitracin application. Instructed to return to ED or follow-up with PCP in 3 days for wound reevaluation/packing removal. I discussed all relevant lab findings and imaging results with pt and they verbalized understanding. Discussed f/u with PCP within 48 hrs and return precautions, pt very amenable to plan.  Final diagnoses:  Abscess    I personally performed the services described in this documentation, which was scribed in my presence. The recorded information has been reviewed and is accurate.    Comer Locket, PA-C 10/14/14 1714  Pattricia Boss, MD 10/16/14 1524

## 2015-08-03 LAB — OB RESULTS CONSOLE GC/CHLAMYDIA
Chlamydia: NEGATIVE
Gonorrhea: NEGATIVE

## 2015-08-03 LAB — OB RESULTS CONSOLE GBS
GBS: NEGATIVE
STREP GROUP B AG: NEGATIVE

## 2015-08-11 ENCOUNTER — Other Ambulatory Visit: Payer: Self-pay | Admitting: General Surgery

## 2015-08-11 NOTE — H&P (Signed)
History of Present Illness Mckenzie Ruff MD; Q000111Q 3:00 PM) Patient words: pil. cyst.  The patient is a 25 year old female who presents with a pilonidal cyst. This is an established patient of our practice who has a long-standing history of pilonidal disease. She initially saw Dr. Georgette Dover in December 2014 and I saw her in November 2015 where she underwent incision and drainage. She has not been to our clinic since then. She reports that she has had several additional I&D's since then. She has also been on antibiotics several times. She states that she developed some swelling about 3 weeks ago it comes and goes. She took antibiotics and sent to the tab and the swelling went down. She reports pretty regular intermittent scant drainage. The area is not tender right now. She denies any fevers or chills. She is now interested in definitive management. She is completing a course of antibiotics.      Problem List/Past Medical Mckenzie Ruff, MD; Q000111Q 3:00 PM) PILONIDAL SINUS WITH ABSCESS (L05.02)  Other Problems Mckenzie Ruff, MD; Q000111Q 3:00 PM) No pertinent past medical history  Past Surgical History Mckenzie Ruff, MD; Q000111Q 3:00 PM) No pertinent past surgical history  Diagnostic Studies History Mckenzie Ruff, MD; Q000111Q 3:00 PM) Mammogram never Colonoscopy never Pap Smear 1-5 years ago  Allergies Mckenzie Lorenzo, LPN; [id number] PM) No Known Drug Allergies 02/12/2014  Medication History Mckenzie Ruff, MD; Q000111Q 3:00 PM) Medications Reconciled Bactrim DS (800-160MG  Tablet, 1 (one) Tablet Oral two times daily, Taken starting 08/07/2015) Active.  Pregnancy / Birth History Mckenzie Ruff, MD; Q000111Q 3:00 PM) Mckenzie Nelson Age at menarche 65 years. Para Nelson Regular periods     Review of Systems Mckenzie Ruff MD; Q000111Q 3:01 PM) General Not Present- Appetite Loss, Chills, Fatigue, Fever, Night Sweats, Weight Gain and Weight  Loss. Skin Present- Non-Healing Wounds. Not Present- Change in Wart/Mole, Dryness, Hives, Jaundice, New Lesions, Rash and Ulcer. HEENT Not Present- Earache, Hearing Loss, Hoarseness, Nose Bleed, Oral Ulcers, Ringing in the Ears, Seasonal Allergies, Sinus Pain, Sore Throat, Visual Disturbances, Wears glasses/contact lenses and Yellow Eyes. Respiratory Not Present- Bloody sputum, Chronic Cough, Difficulty Breathing, Snoring and Wheezing. Breast Not Present- Breast Mass, Breast Pain, Nipple Discharge and Skin Changes. Cardiovascular Not Present- Chest Pain, Difficulty Breathing Lying Down, Leg Cramps, Palpitations, Rapid Heart Rate, Shortness of Breath and Swelling of Extremities. Gastrointestinal Not Present- Abdominal Pain, Bloating, Bloody Stool, Change in Bowel Habits, Chronic diarrhea, Constipation, Difficulty Swallowing, Excessive gas, Gets full quickly at meals, Hemorrhoids, Indigestion, Nausea, Rectal Pain and Vomiting. Female Genitourinary Not Present- Frequency, Nocturia, Painful Urination, Pelvic Pain and Urgency. Musculoskeletal Not Present- Back Pain, Joint Pain, Joint Stiffness, Muscle Pain, Muscle Weakness and Swelling of Extremities. Neurological Not Present- Decreased Memory, Fainting, Headaches, Numbness, Seizures, Tingling, Tremor, Trouble walking and Weakness. Psychiatric Not Present- Anxiety, Bipolar, Change in Sleep Pattern, Depression, Fearful and Frequent crying. Endocrine Not Present- Cold Intolerance, Excessive Hunger, Hair Changes, Heat Intolerance, Hot flashes and New Diabetes. Hematology Not Present- Easy Bruising, Excessive bleeding, Gland problems, HIV and Persistent Infections.  Vitals Claiborne Billings Dockery LPN; [id number] PM) 08/11/2015 2:48 PM Weight: 186.6 lb Height: 64in Body Surface Area: 1.9 m Body Mass Index: 32.03 kg/m  Temp.: 97.42F(Oral)  Pulse: 75 (Regular)  BP: 116/64 (Sitting, Left Arm, Standard)      Physical Exam Mckenzie Ruff MD;  [id number] 3:01 PM)  General Mental Status-Alert. General Appearance-Consistent with stated age. Hydration-Well hydrated. Voice-Normal.  Head and Neck Head-normocephalic, atraumatic with no lesions or palpable  masses. Trachea-midline.  Eye Eyeball - Bilateral-Extraocular movements intact. Sclera/Conjunctiva - Bilateral-No scleral icterus.  Chest and Lung Exam Chest and lung exam reveals -quiet, even and easy respiratory effort with no use of accessory muscles and on auscultation, normal breath sounds, no adventitious sounds and normal vocal resonance. Inspection Chest Wall - Normal. Back - normal.  Cardiovascular Cardiovascular examination reveals -normal heart sounds, regular rate and rhythm with no murmurs.  Abdomen Inspection Inspection of the abdomen reveals - No Hernias. Skin - Scar - no surgical scars. Palpation/Percussion Palpation and Percussion of the abdomen reveal - Soft, Non Tender, No Rebound tenderness, No Rigidity (guarding) and No hepatosplenomegaly.  Rectal Note: gluteal cleft - pinpoint sinus in gluteal cleft; has scar laterally on inner right buttock with active sinus opening draining some seropurulent fluid, medial to open draining wound is about 3cm of induration/fluctuance. not terribly tender. no cellulitis  Neurologic Neurologic evaluation reveals -alert and oriented x 3 with no impairment of recent or remote memory. Mental Status-Normal.  Neuropsychiatric The patient's mood and affect are described as -normal. Judgment and Insight-insight is appropriate concerning matters relevant to self.  Musculoskeletal Normal Exam - Left-Upper Extremity Strength Normal and Lower Extremity Strength Normal. Normal Exam - Right-Upper Extremity Strength Normal and Lower Extremity Strength Normal.    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 I have recommended excision. We discussed this in detail. Risks include bleeding, infection, delayed wound healing and recurrence.

## 2016-02-11 ENCOUNTER — Ambulatory Visit (INDEPENDENT_AMBULATORY_CARE_PROVIDER_SITE_OTHER): Payer: 59 | Admitting: Family Medicine

## 2016-02-11 ENCOUNTER — Encounter: Payer: Self-pay | Admitting: Family Medicine

## 2016-02-11 ENCOUNTER — Encounter: Payer: Self-pay | Admitting: *Deleted

## 2016-02-11 VITALS — BP 137/82 | HR 101 | Wt 184.0 lb

## 2016-02-11 DIAGNOSIS — Z113 Encounter for screening for infections with a predominantly sexual mode of transmission: Secondary | ICD-10-CM

## 2016-02-11 DIAGNOSIS — D573 Sickle-cell trait: Secondary | ICD-10-CM

## 2016-02-11 DIAGNOSIS — Z3401 Encounter for supervision of normal first pregnancy, first trimester: Secondary | ICD-10-CM | POA: Diagnosis not present

## 2016-02-11 DIAGNOSIS — Z1151 Encounter for screening for human papillomavirus (HPV): Secondary | ICD-10-CM

## 2016-02-11 DIAGNOSIS — Z3689 Encounter for other specified antenatal screening: Secondary | ICD-10-CM | POA: Diagnosis not present

## 2016-02-11 DIAGNOSIS — O99011 Anemia complicating pregnancy, first trimester: Secondary | ICD-10-CM

## 2016-02-11 DIAGNOSIS — Z3400 Encounter for supervision of normal first pregnancy, unspecified trimester: Secondary | ICD-10-CM | POA: Insufficient documentation

## 2016-02-11 DIAGNOSIS — O99019 Anemia complicating pregnancy, unspecified trimester: Secondary | ICD-10-CM | POA: Insufficient documentation

## 2016-02-11 DIAGNOSIS — Z124 Encounter for screening for malignant neoplasm of cervix: Secondary | ICD-10-CM

## 2016-02-11 HISTORY — DX: Sickle-cell trait: D57.3

## 2016-02-11 LAB — GLUCOSE, RANDOM: Glucose, Bld: 96 mg/dL (ref 65–99)

## 2016-02-11 NOTE — Progress Notes (Signed)
Last pap 1/15

## 2016-02-11 NOTE — Progress Notes (Signed)
Bedside Abdominal US performed, SIUP noted measuring 10w 4d which correlates with her LMP and FHR=166.

## 2016-02-11 NOTE — Patient Instructions (Signed)
First Trimester of Pregnancy The first trimester of pregnancy is from week 1 until the end of week 12 (months 1 through 3). A week after a sperm fertilizes an egg, the egg will implant on the wall of the uterus. This embryo will begin to develop into a baby. Genes from you and your partner are forming the baby. The female genes determine whether the baby is a boy or a girl. At 6-8 weeks, the eyes and face are formed, and the heartbeat can be seen on ultrasound. At the end of 12 weeks, all the baby's organs are formed.  Now that you are pregnant, you will want to do everything you can to have a healthy baby. Two of the most important things are to get good prenatal care and to follow your health care provider's instructions. Prenatal care is all the medical care you receive before the baby's birth. This care will help prevent, find, and treat any problems during the pregnancy and childbirth. BODY CHANGES Your body goes through many changes during pregnancy. The changes vary from woman to woman.   You may gain or lose a couple of pounds at first.  You may feel sick to your stomach (nauseous) and throw up (vomit). If the vomiting is uncontrollable, call your health care provider.  You may tire easily.  You may develop headaches that can be relieved by medicines approved by your health care provider.  You may urinate more often. Painful urination may mean you have a bladder infection.  You may develop heartburn as a result of your pregnancy.  You may develop constipation because certain hormones are causing the muscles that push waste through your intestines to slow down.  You may develop hemorrhoids or swollen, bulging veins (varicose veins).  Your breasts may begin to grow larger and become tender. Your nipples may stick out more, and the tissue that surrounds them (areola) may become darker.  Your gums may bleed and may be sensitive to brushing and flossing.  Dark spots or blotches  (chloasma, mask of pregnancy) may develop on your face. This will likely fade after the baby is born.  Your menstrual periods will stop.  You may have a loss of appetite.  You may develop cravings for certain kinds of food.  You may have changes in your emotions from day to day, such as being excited to be pregnant or being concerned that something may go wrong with the pregnancy and baby.  You may have more vivid and strange dreams.  You may have changes in your hair. These can include thickening of your hair, rapid growth, and changes in texture. Some women also have hair loss during or after pregnancy, or hair that feels dry or thin. Your hair will most likely return to normal after your baby is born. WHAT TO EXPECT AT YOUR PRENATAL VISITS During a routine prenatal visit:  You will be weighed to make sure you and the baby are growing normally.  Your blood pressure will be taken.  Your abdomen will be measured to track your baby's growth.  The fetal heartbeat will be listened to starting around week 10 or 12 of your pregnancy.  Test results from any previous visits will be discussed. Your health care provider may ask you:  How you are feeling.  If you are feeling the baby move.  If you have had any abnormal symptoms, such as leaking fluid, bleeding, severe headaches, or abdominal cramping.  If you are using any tobacco  are feeling the baby move.  · If you have had any abnormal symptoms, such as leaking fluid, bleeding, severe headaches, or abdominal cramping.  · If you are using any tobacco products, including cigarettes, chewing tobacco, and electronic cigarettes.  · If you have any questions.  Other tests that may be performed during your first trimester include:  · Blood tests to find your blood type and to check for the presence of any previous infections. They will also be used to check for low iron levels (anemia) and Rh antibodies. Later in the pregnancy, blood tests for diabetes will be done along with other tests if problems develop.  · Urine tests to check for infections, diabetes, or protein in the urine.   · An ultrasound to confirm the proper growth and development of the baby.  · An amniocentesis to check for possible genetic problems.  · Fetal screens for spina bifida and Down syndrome.  · You may need other tests to make sure you and the baby are doing well.  · HIV (human immunodeficiency virus) testing. Routine prenatal testing includes screening for HIV, unless you choose not to have this test.  HOME CARE INSTRUCTIONS   Medicines  · Follow your health care provider's instructions regarding medicine use. Specific medicines may be either safe or unsafe to take during pregnancy.  · Take your prenatal vitamins as directed.  · If you develop constipation, try taking a stool softener if your health care provider approves.  Diet  · Eat regular, well-balanced meals. Choose a variety of foods, such as meat or vegetable-based protein, fish, milk and low-fat dairy products, vegetables, fruits, and whole grain breads and cereals. Your health care provider will help you determine the amount of weight gain that is right for you.  · Avoid raw meat and uncooked cheese. These carry germs that can cause birth defects in the baby.  · Eating four or five small meals rather than three large meals a day may help relieve nausea and vomiting. If you start to feel nauseous, eating a few soda crackers can be helpful. Drinking liquids between meals instead of during meals also seems to help nausea and vomiting.  · If you develop constipation, eat more high-fiber foods, such as fresh vegetables or fruit and whole grains. Drink enough fluids to keep your urine clear or pale yellow.  Activity and Exercise  · Exercise only as directed by your health care provider. Exercising will help you:    Control your weight.    Stay in shape.    Be prepared for labor and delivery.  · Experiencing pain or cramping in the lower abdomen or low back is a good sign that you should stop exercising. Check with your health care provider  before continuing normal exercises.  · Try to avoid standing for long periods of time. Move your legs often if you must stand in one place for a long time.  · Avoid heavy lifting.  · Wear low-heeled shoes, and practice good posture.  · You may continue to have sex unless your health care provider directs you otherwise.  Relief of Pain or Discomfort  · Wear a good support bra for breast tenderness.    · Take warm sitz baths to soothe any pain or discomfort caused by hemorrhoids. Use hemorrhoid cream if your health care provider approves.    · Rest with your legs elevated if you have leg cramps or low back pain.  · If you develop varicose veins in your   prenatal visits as directed by your health care provider. Safety   Wear your seat belt at all times when driving.  Make a list of emergency phone numbers, including numbers for family, friends, the hospital, and police and fire departments. General Tips   Ask your health care provider for a referral to a local prenatal education class. Begin classes no later than at the beginning of month 6 of your pregnancy.  Ask for help if you have counseling or nutritional needs during pregnancy. Your health care provider can offer advice or refer you to specialists for help with various needs.  Do not use hot tubs, steam rooms, or saunas.  Do not douche or use tampons or scented sanitary pads.  Do not cross your legs for long periods of time.  Avoid cat litter boxes and soil used by cats. These carry germs that can cause birth defects in the baby and possibly loss of the fetus by miscarriage or stillbirth.  Avoid all smoking, herbs, alcohol, and  medicines not prescribed by your health care provider. Chemicals in these affect the formation and growth of the baby.  Do not use any tobacco products, including cigarettes, chewing tobacco, and electronic cigarettes. If you need help quitting, ask your health care provider. You may receive counseling support and other resources to help you quit.  Schedule a dentist appointment. At home, brush your teeth with a soft toothbrush and be gentle when you floss. SEEK MEDICAL CARE IF:   You have dizziness.  You have mild pelvic cramps, pelvic pressure, or nagging pain in the abdominal area.  You have persistent nausea, vomiting, or diarrhea.  You have a bad smelling vaginal discharge.  You have pain with urination.  You notice increased swelling in your face, hands, legs, or ankles. SEEK IMMEDIATE MEDICAL CARE IF:   You have a fever.  You are leaking fluid from your vagina.  You have spotting or bleeding from your vagina.  You have severe abdominal cramping or pain.  You have rapid weight gain or loss.  You vomit blood or material that looks like coffee grounds.  You are exposed to [place] measles and have never had them.  You are exposed to fifth disease or chickenpox.  You develop a severe headache.  You have shortness of breath.  You have any kind of trauma, such as from a fall or a car accident. This information is not intended to replace advice given to you by your health care provider. Make sure you discuss any questions you have with your health care provider. Document Released: 03/08/2001 Document Revised: 04/04/2014 Document Reviewed: 01/22/2013 Elsevier Interactive Patient Education  2017 [employer].   Breastfeeding Deciding to breastfeed is one of the best choices you can make for you and your baby. A change in hormones during pregnancy causes your breast tissue to grow and increases the number and size of your milk ducts. These hormones also allow proteins,  sugars, and fats from your blood supply to make breast milk in your milk-producing glands. Hormones prevent breast milk from being released before your baby is born as well as prompt milk flow after birth. Once breastfeeding has begun, thoughts of your baby, as well as his or her sucking or crying, can stimulate the release of milk from your milk-producing glands. Benefits of breastfeeding For Your Baby  Your first milk (colostrum) helps your baby's digestive system function better.  There are antibodies in your milk that help your baby fight off infections.  Your baby has a lower incidence of asthma, allergies, and sudden infant death syndrome.  The nutrients in breast milk are better for your baby than infant formulas and are designed uniquely for your baby's needs.  Breast milk improves your baby's brain development.  Your baby is less likely to develop other conditions, such as childhood obesity, asthma, or type 2 diabetes mellitus. For You  Breastfeeding helps to create a very special bond between you and your baby.  Breastfeeding is convenient. Breast milk is always available at the correct temperature and costs nothing.  Breastfeeding helps to burn calories and helps you lose the weight gained during pregnancy.  Breastfeeding makes your uterus contract to its prepregnancy size faster and slows bleeding (lochia) after you give birth.  Breastfeeding helps to lower your risk of developing type 2 diabetes mellitus, osteoporosis, and breast or ovarian cancer later in life. Signs that your baby is hungry Early Signs of Hunger  Increased alertness or activity.  Stretching.  Movement of the head from side to side.  Movement of the head and opening of the mouth when the corner of the mouth or cheek is stroked (rooting).  Increased sucking sounds, smacking lips, cooing, sighing, or squeaking.  Hand-to-mouth movements.  Increased sucking of fingers or hands. Late Signs of  Hunger  Fussing.  Intermittent crying. Extreme Signs of Hunger  Signs of extreme hunger will require calming and consoling before your baby will be able to breastfeed successfully. Do not wait for the following signs of extreme hunger to occur before you initiate breastfeeding:  Restlessness.  A loud, strong cry.  Screaming. Breastfeeding basics  Breastfeeding Initiation  Find a comfortable place to sit or lie down, with your neck and back well supported.  Place a pillow or rolled up blanket under your baby to bring him or her to the level of your breast (if you are seated). Nursing pillows are specially designed to help support your arms and your baby while you breastfeed.  Make sure that your baby's abdomen is facing your abdomen.  Gently massage your breast. With your fingertips, massage from your chest wall toward your nipple in a circular motion. This encourages milk flow. You may need to continue this action during the feeding if your milk flows slowly.  Support your breast with 4 fingers underneath and your thumb above your nipple. Make sure your fingers are well away from your nipple and your baby's mouth.  Stroke your baby's lips gently with your finger or nipple.  When your baby's mouth is open wide enough, quickly bring your baby to your breast, placing your entire nipple and as much of the colored area around your nipple (areola) as possible into your baby's mouth.  More areola should be visible above your baby's upper lip than below the lower lip.  Your baby's tongue should be between his or her lower gum and your breast.  Ensure that your baby's mouth is correctly positioned around your nipple (latched). Your baby's lips should create a seal on your breast and be turned out (everted).  It is common for your baby to suck about 2-3 minutes in order to start the flow of breast milk. Latching  Teaching your baby how to latch on to your breast properly is very  important. An improper latch can cause nipple pain and decreased milk supply for you and poor weight gain in your baby. Also, if your baby is not latched onto your nipple properly, he or she may swallow  some air during feeding. This can make your baby fussy. Burping your baby when you switch breasts during the feeding can help to get rid of the air. However, teaching your baby to latch on properly is still the best way to prevent fussiness from swallowing air while breastfeeding. Signs that your baby has successfully latched on to your nipple:  Silent tugging or silent sucking, without causing you pain.  Swallowing heard between every 3-4 sucks.  Muscle movement above and in front of his or her ears while sucking. Signs that your baby has not successfully latched on to nipple:  Sucking sounds or smacking sounds from your baby while breastfeeding.  Nipple pain. If you think your baby has not latched on correctly, slip your finger into the corner of your baby's mouth to break the suction and place it between your baby's gums. Attempt breastfeeding initiation again. Signs of Successful Breastfeeding  Signs from your baby:  A gradual decrease in the number of sucks or complete cessation of sucking.  Falling asleep.  Relaxation of his or her body.  Retention of a small amount of milk in his or her mouth.  Letting go of your breast by himself or herself. Signs from you:  Breasts that have increased in firmness, weight, and size 1-3 hours after feeding.  Breasts that are softer immediately after breastfeeding.  Increased milk volume, as well as a change in milk consistency and color by the fifth day of breastfeeding.  Nipples that are not sore, cracked, or bleeding. Signs That Your [employer] is Getting Enough Milk  Wetting at least 1-2 diapers during the first 24 hours after birth.  Wetting at least 5-6 diapers every 24 hours for the first week after birth. The urine should be clear or  pale yellow by 5 days after birth.  Wetting 6-8 diapers every 24 hours as your baby continues to grow and develop.  At least 3 stools in a 24-hour period by age 65 days. The stool should be soft and yellow.  At least 3 stools in a 24-hour period by age 60 days. The stool should be seedy and yellow.  No loss of weight greater than 10% of birth weight during the first 50 days of age.  Average weight gain of 4-7 ounces (113-198 g) per week after age 42 days.  Consistent daily weight gain by age 26 days, without weight loss after the age of 2 weeks. After a feeding, your baby may spit up a small amount. This is common. Breastfeeding frequency and duration Frequent feeding will help you make more milk and can prevent sore nipples and breast engorgement. Breastfeed when you feel the need to reduce the fullness of your breasts or when your baby shows signs of hunger. This is called "breastfeeding on demand." Avoid introducing a pacifier to your baby while you are working to establish breastfeeding (the first 4-6 weeks after your baby is born). After this time you may choose to use a pacifier. Research has shown that pacifier use during the first year of a baby's life decreases the risk of sudden infant death syndrome (SIDS). Allow your baby to feed on each breast as long as he or she wants. Breastfeed until your baby is finished feeding. When your baby unlatches or falls asleep while feeding from the first breast, offer the second breast. Because newborns are often sleepy in the first few weeks of life, you may need to awaken your baby to get him or her to feed. Breastfeeding  times will vary from baby to baby. However, the following rules can serve as a guide to help you ensure that your baby is properly fed:  Newborns (babies 32 weeks of age or younger) may breastfeed every 1-3 hours.  Newborns should not go longer than 3 hours during the day or 5 hours during the night without breastfeeding.  You should  breastfeed your baby a minimum of 8 times in a 24-hour period until you begin to introduce solid foods to your baby at around 72 months of age. Breast milk pumping Pumping and storing breast milk allows you to ensure that your baby is exclusively fed your breast milk, even at times when you are unable to breastfeed. This is especially important if you are going back to work while you are still breastfeeding or when you are not able to be present during feedings. Your lactation consultant can give you guidelines on how long it is safe to store breast milk. A breast pump is a machine that allows you to pump milk from your breast into a sterile bottle. The pumped breast milk can then be stored in a refrigerator or freezer. Some breast pumps are operated by hand, while others use electricity. Ask your lactation consultant which type will work best for you. Breast pumps can be purchased, but some hospitals and breastfeeding support groups lease breast pumps on a monthly basis. A lactation consultant can teach you how to hand express breast milk, if you prefer not to use a pump. Caring for your breasts while you breastfeed Nipples can become dry, cracked, and sore while breastfeeding. The following recommendations can help keep your breasts moisturized and healthy:  Avoid using soap on your nipples.  Wear a supportive bra. Although not required, special nursing bras and tank tops are designed to allow access to your breasts for breastfeeding without taking off your entire bra or top. Avoid wearing underwire-style bras or extremely tight bras.  Air dry your nipples for 3-31minutes after each feeding.  Use only cotton bra pads to absorb leaked breast milk. Leaking of breast milk between feedings is normal.  Use lanolin on your nipples after breastfeeding. Lanolin helps to maintain your skin's normal moisture barrier. If you use pure lanolin, you do not need to wash it off before feeding your baby again. Pure  lanolin is not toxic to your baby. You may also hand express a few drops of breast milk and gently massage that milk into your nipples and allow the milk to air dry. In the first few weeks after giving birth, some women experience extremely full breasts (engorgement). Engorgement can make your breasts feel heavy, warm, and tender to the touch. Engorgement peaks within 3-5 days after you give birth. The following recommendations can help ease engorgement:  Completely empty your breasts while breastfeeding or pumping. You may want to start by applying warm, moist heat (in the shower or with warm water-soaked hand towels) just before feeding or pumping. This increases circulation and helps the milk flow. If your baby does not completely empty your breasts while breastfeeding, pump any extra milk after he or she is finished.  Wear a snug bra (nursing or regular) or tank top for 1-2 days to signal your body to slightly decrease milk production.  Apply ice packs to your breasts, unless this is too uncomfortable for you.  Make sure that your baby is latched on and positioned properly while breastfeeding. If engorgement persists after 48 hours of following these recommendations, contact  your health care provider or a Science writer. Overall health care recommendations while breastfeeding  Eat healthy foods. Alternate between meals and snacks, eating 3 of each per day. Because what you eat affects your breast milk, some of the foods may make your baby more irritable than usual. Avoid eating these foods if you are sure that they are negatively affecting your baby.  Drink milk, fruit juice, and water to satisfy your thirst (about 10 glasses a day).  Rest often, relax, and continue to take your prenatal vitamins to prevent fatigue, stress, and anemia.  Continue breast self-awareness checks.  Avoid chewing and smoking tobacco. Chemicals from cigarettes that pass into breast milk and exposure to  secondhand smoke may harm your baby.  Avoid alcohol and drug use, including marijuana. Some medicines that may be harmful to your baby can pass through breast milk. It is important to ask your health care provider before taking any medicine, including all over-the-counter and prescription medicine as well as vitamin and herbal supplements. It is possible to become pregnant while breastfeeding. If birth control is desired, ask your health care provider about options that will be safe for your baby. Contact a health care provider if:  You feel like you want to stop breastfeeding or have become frustrated with breastfeeding.  You have painful breasts or nipples.  Your nipples are cracked or bleeding.  Your breasts are red, tender, or warm.  You have a swollen area on either breast.  You have a fever or chills.  You have nausea or vomiting.  You have drainage other than breast milk from your nipples.  Your breasts do not become full before feedings by the fifth day after you give birth.  You feel sad and depressed.  Your baby is too sleepy to eat well.  Your baby is having trouble sleeping.  Your baby is wetting less than 3 diapers in a 24-hour period.  Your baby has less than 3 stools in a 24-hour period.  Your baby's skin or the white part of his or her eyes becomes yellow.  Your baby is not gaining weight by 89 days of age. Get help right away if:  Your baby is overly tired (lethargic) and does not want to wake up and feed.  Your baby develops an unexplained fever. This information is not intended to replace advice given to you by your health care provider. Make sure you discuss any questions you have with your health care provider. Document Released: 03/14/2005 Document Revised: 08/26/2015 Document Reviewed: 09/05/2012 Elsevier Interactive Patient Education  2017 [employer].

## 2016-02-11 NOTE — Progress Notes (Signed)
   Subjective:    Mckenzie Nelson is a G1P0 [redacted]w[redacted]d being seen today for her first obstetrical visit.  Her obstetrical history is significant for obesity.  Pregnancy history fully reviewed.  Patient reports no complaints.  Vitals:   02/11/16 0955  BP: 137/82  Pulse: (!) 101  Weight: 184 lb (83.5 kg)    HISTORY: OB History  Gravida Para Term Preterm AB Living  1            SAB TAB Ectopic Multiple Live Births               # Outcome Date GA Lbr Len/2nd Weight Sex Delivery Anes PTL Lv  1 Current              Past Medical History:  Diagnosis Date  . Pilonidal cyst    History reviewed. No pertinent surgical history. Family History  Problem Relation Age of Onset  . Diabetes Maternal Grandmother   . Stroke Maternal Grandmother   . Emphysema Maternal Grandfather   . Heart disease Maternal Grandfather      Exam    Uterus:   12 wk size  Pelvic Exam:    Perineum: Normal Perineum, some hidradenitis, one open and draining   Vulva: Bartholin's, Urethra, Skene's normal   Vagina:  normal mucosa, normal discharge   Cervix: no lesions and nulliparous appearance   Adnexa: normal adnexa and no mass, fullness, tenderness   Bony Pelvis: average  System: Breast:  normal appearance, no masses or tenderness   Skin: normal coloration and turgor, no rashes    Neurologic: oriented, normal mood   Extremities: normal strength, tone, and muscle mass   HEENT extra ocular movement intact and sclera clear, anicteric   Mouth/Teeth mucous membranes moist, pharynx normal without lesions   Neck supple   Cardiovascular: regular rate and rhythm, no murmurs or gallops   Respiratory:  appears well, vitals normal, no respiratory distress, acyanotic, normal RR, ear and throat exam is normal, neck free of mass or lymphadenopathy, chest clear, no wheezing, crepitations, rhonchi, normal symmetric air entry   Abdomen: soft, non-tender; bowel sounds normal; no masses,  no organomegaly   U/S reveals SIUP  with flicker measuring 10 wk 4 d   Assessment/Plan:     1. Encounter for supervision of normal first pregnancy in first trimester Initial labs drawn Desires first screen On Baby Rx Anatomy  U/s ordered Has SST--advised FOB for testing. Inheritance discussed.  - CULTURE, URINE COMPREHENSIVE - Urine cytology ancillary only - HgB A1c - Prenatal Profile - Cystic fibrosis diagnostic study - Pain Mgmt, Profile 6 Conf w/o mM, U - Glucose - [place] bedside; Future - US Fetal Nuchal Translucency Measurement; Future - [place] MFM OB COMP + 14 WK; Future - Cytology - PAP    Donnamae Jude 02/11/2016

## 2016-02-12 LAB — PRENATAL PROFILE (SOLSTAS)
Antibody Screen: NEGATIVE
Basophils Absolute: 0 cells/uL (ref 0–200)
Basophils Relative: 0 %
EOS PCT: 1 %
Eosinophils Absolute: 62 cells/uL (ref 15–500)
HEMATOCRIT: 35.3 % (ref 35.0–45.0)
HIV: NONREACTIVE
Hemoglobin: 12.3 g/dL (ref 11.7–15.5)
Hepatitis B Surface Ag: NEGATIVE
Lymphocytes Relative: 25 %
Lymphs Abs: 1550 cells/uL (ref 850–3900)
MCH: 29.9 pg (ref 27.0–33.0)
MCHC: 34.8 g/dL (ref 32.0–36.0)
MCV: 85.9 fL (ref 80.0–100.0)
MONO ABS: 310 {cells}/uL (ref 200–950)
MPV: 9.6 fL (ref 7.5–12.5)
Monocytes Relative: 5 %
NEUTROS PCT: 69 %
Neutro Abs: 4278 cells/uL (ref 1500–7800)
Platelets: 282 10*3/uL (ref 140–400)
RBC: 4.11 MIL/uL (ref 3.80–5.10)
RDW: 15.3 % — AB (ref 11.0–15.0)
RH TYPE: POSITIVE
Rubella: 22.4 Index — ABNORMAL HIGH (ref <0.90)
WBC: 6.2 10*3/uL (ref 3.8–10.8)

## 2016-02-12 LAB — HEMOGLOBIN A1C
Hgb A1c MFr Bld: 4.6 % (ref <5.7)
MEAN PLASMA GLUCOSE: 85 mg/dL

## 2016-02-12 LAB — URINE CYTOLOGY ANCILLARY ONLY
CHLAMYDIA, DNA PROBE: NEGATIVE
NEISSERIA GONORRHEA: NEGATIVE

## 2016-02-13 ENCOUNTER — Encounter: Payer: Self-pay | Admitting: Family Medicine

## 2016-02-13 DIAGNOSIS — Z87898 Personal history of other specified conditions: Secondary | ICD-10-CM | POA: Insufficient documentation

## 2016-02-13 DIAGNOSIS — F1291 Cannabis use, unspecified, in remission: Secondary | ICD-10-CM | POA: Insufficient documentation

## 2016-02-13 LAB — PAIN MGMT, PROFILE 6 CONF W/O MM, U
6 ACETYLMORPHINE: NEGATIVE ng/mL (ref <10)
AMPHETAMINES: NEGATIVE ng/mL (ref <500)
Alcohol Metabolites: NEGATIVE ng/mL (ref <500)
Barbiturates: NEGATIVE ng/mL (ref <300)
Benzodiazepines: NEGATIVE ng/mL (ref <100)
COCAINE METABOLITE: NEGATIVE ng/mL (ref <150)
CREATININE: 120.1 mg/dL (ref >=20.0)
Marijuana Metabolite: 23 ng/mL — ABNORMAL HIGH (ref <5)
Marijuana Metabolite: POSITIVE ng/mL — AB (ref <20)
Methadone Metabolite: NEGATIVE ng/mL (ref <100)
OXYCODONE: NEGATIVE ng/mL (ref <100)
Opiates: NEGATIVE ng/mL (ref <100)
Oxidant: NEGATIVE ug/mL (ref <200)
Phencyclidine: NEGATIVE ng/mL (ref <25)
Please note:: 0
pH: 7.14 (ref 4.5–9.0)

## 2016-02-15 LAB — CYTOLOGY - PAP
ADEQUACY: ABSENT
Diagnosis: NEGATIVE

## 2016-02-16 LAB — CULTURE, URINE COMPREHENSIVE

## 2016-02-17 LAB — CYSTIC FIBROSIS DIAGNOSTIC STUDY

## 2016-02-25 ENCOUNTER — Ambulatory Visit (HOSPITAL_COMMUNITY): Payer: 59

## 2016-03-18 ENCOUNTER — Inpatient Hospital Stay (HOSPITAL_COMMUNITY)
Admission: AD | Admit: 2016-03-18 | Discharge: 2016-03-18 | Disposition: A | Discharge status: Home / Self Care | Payer: 59 | Source: Ambulatory Visit | Attending: Obstetrics and Gynecology | Admitting: Obstetrics and Gynecology

## 2016-03-18 ENCOUNTER — Encounter (HOSPITAL_COMMUNITY): Payer: Self-pay | Admitting: *Deleted

## 2016-03-18 DIAGNOSIS — L0591 Pilonidal cyst without abscess: Secondary | ICD-10-CM | POA: Diagnosis not present

## 2016-03-18 DIAGNOSIS — O9989 Other specified diseases and conditions complicating pregnancy, childbirth and the puerperium: Secondary | ICD-10-CM | POA: Insufficient documentation

## 2016-03-18 DIAGNOSIS — N949 Unspecified condition associated with female genital organs and menstrual cycle: Secondary | ICD-10-CM | POA: Diagnosis not present

## 2016-03-18 DIAGNOSIS — R1031 Right lower quadrant pain: Secondary | ICD-10-CM | POA: Diagnosis present

## 2016-03-18 DIAGNOSIS — Z87891 Personal history of nicotine dependence: Secondary | ICD-10-CM | POA: Insufficient documentation

## 2016-03-18 DIAGNOSIS — O26892 Other specified pregnancy related conditions, second trimester: Secondary | ICD-10-CM | POA: Insufficient documentation

## 2016-03-18 DIAGNOSIS — Z3A16 16 weeks gestation of pregnancy: Secondary | ICD-10-CM | POA: Insufficient documentation

## 2016-03-18 LAB — URINALYSIS, ROUTINE W REFLEX MICROSCOPIC
Bilirubin Urine: NEGATIVE
Glucose, UA: NEGATIVE mg/dL
HGB URINE DIPSTICK: NEGATIVE
KETONES UR: NEGATIVE mg/dL
LEUKOCYTES UA: NEGATIVE
Nitrite: NEGATIVE
PROTEIN: NEGATIVE mg/dL
Specific Gravity, Urine: 1.012 (ref 1.005–1.030)
pH: 7 (ref 5.0–8.0)

## 2016-03-18 NOTE — MAU Note (Signed)
Pt reports she started having a sharp pain on lower right abd/groin area yesterday. Tried laying down and did not relieve the pain .

## 2016-03-18 NOTE — MAU Provider Note (Signed)
Chief Complaint:  Abdominal Pain   First Provider Initiated Contact with Patient 03/18/16 1637     HPI: Mckenzie Nelson is a 25 y.o. G1P0 at 17w2dwho presents to maternity admissions reporting sharp pain in right lower abdomen since yesterday. Hurts more to turn over but hurts all the time.  No bleeding. She denies LOF, vaginal bleeding, vaginal itching/burning, urinary symptoms, h/a, dizziness, n/v, diarrhea, constipation or fever/chills.    Abdominal Pain  This is a new problem. The current episode started yesterday. The onset quality is gradual. The problem occurs constantly. The problem has been unchanged. The pain is located in the RLQ. The quality of the pain is cramping and sharp. The abdominal pain does not radiate. Pertinent negatives include no anorexia, constipation, diarrhea, dysuria, fever, frequency, headaches, myalgias, nausea or vomiting. The pain is aggravated by palpation and movement. The pain is relieved by nothing. She has tried nothing for the symptoms.   RN Note: Pt reports she started having a sharp pain on lower right abd/groin area yesterday. Tried laying down and did not relieve the pain .  Past Medical History: Past Medical History:  Diagnosis Date  . Pilonidal cyst     Past obstetric history: OB History  Gravida Para Term Preterm AB Living  1            SAB TAB Ectopic Multiple Live Births               # Outcome Date GA Lbr Len/2nd Weight Sex Delivery Anes PTL Lv  1 Current               Past Surgical History: Past Surgical History:  Procedure Laterality Date  . NO PAST SURGERIES      Family History: Family History  Problem Relation Age of Onset  . Diabetes Maternal Grandmother   . Stroke Maternal Grandmother   . Emphysema Maternal Grandfather   . Heart disease Maternal Grandfather     Social History: Social History  Substance Use Topics  . Smoking status: Former Research scientist (life sciences)  . Smokeless tobacco: Never Used  . Alcohol use No    Allergies:  No Known Allergies  Meds:  No prescriptions prior to admission.    I have reviewed patient's Past Medical Hx, Surgical Hx, Family Hx, Social Hx, medications and allergies.   ROS:  Review of Systems  Constitutional: Negative for fever.  Gastrointestinal: Positive for abdominal pain. Negative for anorexia, constipation, diarrhea, nausea and vomiting.  Genitourinary: Negative for dysuria and frequency.  Musculoskeletal: Negative for myalgias.  Neurological: Negative for headaches.   Other systems negative  Physical Exam  Patient Vitals for the past 24 hrs:  BP Temp Pulse Resp  03/18/16 1623 139/82 97.8 F (36.6 C) 109 18   Constitutional: Well-developed, well-nourished female in no acute distress.  Cardiovascular: normal rate and rhythm Respiratory: normal effort, clear to auscultation bilaterally GI: Abd soft, tender to palpation over right round ligament, gravid appropriate for gestational age.   No rebound or guarding.   MS: Extremities nontender, no edema, normal ROM Neurologic: Alert and oriented x 4.  GU: Neg CVAT.  PELVIC EXAM: Cervix long and closed  FHT:  155   Labs: No results found for this or any previous visit (from the past 24 hour(s)). B/POS/-- (11/16 1007)  Imaging:  No results found.  MAU Course/MDM: I have ordered labs and reviewed results.  Differential includes RLP, UTI, or even appendicitis, but in light of no fever, rebound or guarding will not  image appendix.  Urine is clear.  I emphasized with her that if she develops fever, N/V, loss of appetite or worsening pain, come back for pursuance of appendicitis evaluation.  Assessment: SIUP at [redacted]w[redacted]d Right lower quadrant pain Likely round ligament pain  Plan: Discharge home Come back if symptoms worsen or if new symptorms arise Preterm Labor precautions and fetal kick counts Follow up in Office for prenatal visits and recheck  Encouraged to return here or to other Urgent Care/ED if she develops  worsening of symptoms, increase in pain, fever, or other concerning symptoms.   Pt stable at time of discharge.  Hansel Feinstein CNM, MSN Certified Nurse-Midwife 03/18/2016 4:38 PM

## 2016-03-18 NOTE — Discharge Instructions (Signed)
Round Ligament Pain Introduction The round ligament is a cord of muscle and tissue that helps to support the uterus. It can become a source of pain during pregnancy if it becomes stretched or twisted as the baby grows. The pain usually begins in the second trimester of pregnancy, and it can come and go until the baby is delivered. It is not a serious problem, and it does not cause harm to the baby. Round ligament pain is usually a short, sharp, and pinching pain, but it can also be a dull, lingering, and aching pain. The pain is felt in the lower side of the abdomen or in the groin. It usually starts deep in the groin and moves up to the outside of the hip area. Pain can occur with:  A sudden change in position.  Rolling over in bed.  Coughing or sneezing.  Physical activity. Follow these instructions at home: Watch your condition for any changes. Take these steps to help with your pain:  When the pain starts, relax. Then try:  Sitting down.  Flexing your knees up to your abdomen.  Lying on your side with one pillow under your abdomen and another pillow between your legs.  Sitting in a warm bath for 15-20 minutes or until the pain goes away.  Take over-the-counter and prescription medicines only as told by your health care provider.  Move slowly when you sit and stand.  Avoid long walks if they cause pain.  Stop or lessen your physical activities if they cause pain. Contact a health care provider if:  Your pain does not go away with treatment.  You feel pain in your back that you did not have before.  Your medicine is not helping. Get help right away if:  You develop a fever or chills.  You develop uterine contractions.  You develop vaginal bleeding.  You develop nausea or vomiting.  You develop diarrhea.  You have pain when you urinate. This information is not intended to replace advice given to you by your health care provider. Make sure you discuss any questions  you have with your health care provider. Document Released: 12/22/2007 Document Revised: 08/20/2015 Document Reviewed: 05/21/2014  2017 Elsevier  

## 2016-03-23 ENCOUNTER — Encounter (HOSPITAL_COMMUNITY): Payer: Self-pay | Admitting: Family Medicine

## 2016-03-28 NOTE — L&D Delivery Note (Signed)
Patient is 26 y.o. G1P0000 [redacted]w[redacted]d admitted for SOL.   Delivery Note At 7:41 AM a healthy female was delivered via Vaginal, Spontaneous Delivery (Presentation: R occiput anterior).  APGAR: 9, 9; weight  pending.   Placenta status: intact. Cord: 3V with the following complications: none.  Cord pH: N/A.  Anesthesia:  Epidural Episiotomy: None Lacerations:  None Est. Blood Loss (mL): 250    Mom to postpartum.  Baby to Couplet care / Skin to Skin.  Upon arrival patient was complete and pushing. She pushed with good maternal effort to deliver a healthy baby boy. Baby delivered without difficulty, was noted to have good tone and place on maternal abdomen for oral suctioning, drying and stimulation. Delayed cord clamping performed. Placenta delivered intact with 3V cord. Vaginal canal and perineum was inspected and no lacerated noted; hemostatic. Pitocin was started and uterus massaged until bleeding slowed. Counts of sharps, instruments, and lap pads were all correct.   Adin Hector, MD 08/22/2016, 8:02 AM

## 2016-04-04 ENCOUNTER — Ambulatory Visit (HOSPITAL_COMMUNITY)
Admission: RE | Admit: 2016-04-04 | Discharge: 2016-04-04 | Disposition: A | Discharge status: Home / Self Care | Payer: 59 | Source: Ambulatory Visit | Attending: Family Medicine | Admitting: Family Medicine

## 2016-04-04 ENCOUNTER — Other Ambulatory Visit: Payer: Self-pay | Admitting: Family Medicine

## 2016-04-04 DIAGNOSIS — Z3689 Encounter for other specified antenatal screening: Secondary | ICD-10-CM

## 2016-04-04 DIAGNOSIS — O3412 Maternal care for benign tumor of corpus uteri, second trimester: Secondary | ICD-10-CM | POA: Diagnosis not present

## 2016-04-04 DIAGNOSIS — D259 Leiomyoma of uterus, unspecified: Secondary | ICD-10-CM

## 2016-04-04 DIAGNOSIS — Z3A18 18 weeks gestation of pregnancy: Secondary | ICD-10-CM

## 2016-04-04 DIAGNOSIS — Z3401 Encounter for supervision of normal first pregnancy, first trimester: Secondary | ICD-10-CM

## 2016-04-11 ENCOUNTER — Ambulatory Visit (INDEPENDENT_AMBULATORY_CARE_PROVIDER_SITE_OTHER): Payer: 59 | Admitting: Obstetrics and Gynecology

## 2016-04-11 ENCOUNTER — Encounter: Payer: Self-pay | Admitting: Obstetrics and Gynecology

## 2016-04-11 ENCOUNTER — Encounter: Payer: Self-pay | Admitting: *Deleted

## 2016-04-11 VITALS — BP 129/89 | HR 105 | Wt 187.0 lb

## 2016-04-11 DIAGNOSIS — O99011 Anemia complicating pregnancy, first trimester: Secondary | ICD-10-CM

## 2016-04-11 DIAGNOSIS — D573 Sickle-cell trait: Secondary | ICD-10-CM

## 2016-04-11 DIAGNOSIS — Z3401 Encounter for supervision of normal first pregnancy, first trimester: Secondary | ICD-10-CM

## 2016-04-11 NOTE — Progress Notes (Signed)
Prenatal Visit Note Date: 04/11/2016 Clinic: [facility]  Subjective:  Mckenzie Nelson is a 26 y.o. G1P0 at [redacted]w[redacted]d being seen today for ongoing prenatal care.  She is currently monitored for the following issues for this low-risk pregnancy and has Encounter for supervision of normal first pregnancy in first trimester; Sickle cell trait ([place]); and Marijuana use on her problem list.  Patient reports see RN note. not persistent. .   Contractions: Not present. Vag. Bleeding: None.  Movement: Present. Denies leaking of fluid.   The following portions of the patient's history were reviewed and updated as appropriate: allergies, current medications, past family history, past medical history, past social history, past surgical history and problem list. Problem list updated.  Objective:   Vitals:   04/11/16 1427  BP: 129/89  Pulse: (!) 105  Weight: 187 lb (84.8 kg)    Fetal Status: Fetal Heart Rate (bpm): 144   Movement: Present     General:  Alert, oriented and cooperative. Patient is in no acute distress.  Skin: Skin is warm and dry. No rash noted.   Cardiovascular: Normal heart rate noted  Respiratory: Normal respiratory effort, no problems with respiration noted  Abdomen: Soft, gravid, appropriate for gestational age. Pain/Pressure: Absent     Pelvic:  Cervical exam deferred        Extremities: Normal range of motion.  Edema: None  Mental Status: Normal mood and affect. Normal behavior. Normal judgment and thought content.   Urinalysis:      Assessment and Plan:  Pregnancy: G1P0 at [redacted]w[redacted]d  1. Sickle cell trait ([place]) UCx screening today - Urine Culture  2. Encounter for supervision of normal first pregnancy in first trimester Routine care. Negative anatomy scan. Declines genetics. D/w her re: hydration and always having snacks on hand.   Preterm labor symptoms and general obstetric precautions including but not limited to vaginal bleeding, contractions, leaking  of fluid and fetal movement were reviewed in detail with the patient. Please refer to After Visit Summary for other counseling recommendations.  Return in about 8 weeks (around 06/06/2016) for per baby scripts, Return OB.   Aletha Halim, MD

## 2016-04-11 NOTE — Progress Notes (Signed)
Pt states she has been feeling lightheaded, weak, and nauseated. She checked her BP at home with reading of 76/54.

## 2016-04-12 ENCOUNTER — Encounter: Payer: 59 | Admitting: Family Medicine

## 2016-04-13 LAB — URINE CULTURE

## 2016-05-02 ENCOUNTER — Telehealth: Payer: Self-pay | Admitting: *Deleted

## 2016-05-02 NOTE — Telephone Encounter (Signed)
-----   Message from Blanchie Dessert, [place] sent at 05/02/2016  1:26 PM EST ----- Regarding: concerns about the GTT testing Contact: [phone] Doesn't want to drink "the stuff" to get her GTT done and wants to know if there is another alternative

## 2016-05-02 NOTE — Telephone Encounter (Signed)
Spoke to pt about the 75gm sugar, informed her that she may bring her own jelly beans to the appointment and can use that alternative if she prefers, pt will opt to do the 2 hr GTT with jelly beans.  Instructed to be fasting and bring jelly beans with her, acknowledged instructions.

## 2016-05-25 ENCOUNTER — Encounter: Payer: 59 | Admitting: Obstetrics and Gynecology

## 2016-05-25 ENCOUNTER — Telehealth: Payer: Self-pay | Admitting: Radiology

## 2016-05-25 NOTE — Telephone Encounter (Signed)
Called and left a message on the cell phone to reschedule appointment with Dr Kennon Rounds on 06/01/16. Dr Kennon Rounds has a meeting and wont be in that am

## 2016-06-01 ENCOUNTER — Ambulatory Visit (INDEPENDENT_AMBULATORY_CARE_PROVIDER_SITE_OTHER): Payer: 59 | Admitting: Family Medicine

## 2016-06-01 VITALS — BP 132/82 | HR 114 | Wt 191.0 lb

## 2016-06-01 DIAGNOSIS — Z3401 Encounter for supervision of normal first pregnancy, first trimester: Secondary | ICD-10-CM

## 2016-06-01 DIAGNOSIS — Z3400 Encounter for supervision of normal first pregnancy, unspecified trimester: Secondary | ICD-10-CM

## 2016-06-01 NOTE — Progress Notes (Signed)
   PRENATAL VISIT NOTE  Subjective:  Mckenzie Nelson is a 26 y.o. G1P0 at [redacted]w[redacted]d being seen today for ongoing prenatal care.  She is currently monitored for the following issues for this low-risk pregnancy and has Supervision of normal first pregnancy, antepartum; Sickle cell trait ([place]); and Marijuana use on her problem list.  Patient reports no complaints.  Contractions: Not present. Vag. Bleeding: None.  Movement: Present. Denies leaking of fluid.   The following portions of the patient's history were reviewed and updated as appropriate: allergies, current medications, past family history, past medical history, past social history, past surgical history and problem list. Problem list updated.  Objective:   Vitals:   06/01/16 0955  BP: 132/82  Pulse: (!) 114  Weight: 191 lb (86.6 kg)    Fetal Status: Fetal Heart Rate (bpm): 140   Movement: Present     General:  Alert, oriented and cooperative. Patient is in no acute distress.  Skin: Skin is warm and dry. No rash noted.   Cardiovascular: Normal heart rate noted  Respiratory: Normal respiratory effort, no problems with respiration noted  Abdomen: Soft, gravid, appropriate for gestational age. Pain/Pressure: Absent     Pelvic:  Cervical exam deferred        Extremities: Normal range of motion.  Edema: None  Mental Status: Normal mood and affect. Normal behavior. Normal judgment and thought content.   Assessment and Plan:  Pregnancy: G1P0 at [redacted]w[redacted]d  1. Supervision of normal first pregnancy, antepartum Continue routine prenatal care.  - CBC - RPR - HIV antibody - Glucose Declines glucola testing--will check random CBG today and Fasting at next visit--if both normal safe to assume no GDM. Declines TDaP  Preterm labor symptoms and general obstetric precautions including but not limited to vaginal bleeding, contractions, leaking of fluid and fetal movement were reviewed in detail with the patient. Please refer to After Visit  Summary for other counseling recommendations.  Return in 4 weeks (on 06/29/2016).   Donnamae Jude, MD

## 2016-06-01 NOTE — Patient Instructions (Signed)
 Third Trimester of Pregnancy The third trimester is from week 28 through week 40 (months 7 through 9). The third trimester is a time when the unborn baby (fetus) is growing rapidly. At the end of the ninth month, the fetus is about 20 inches in length and weighs 6-10 pounds. Body changes during your third trimester Your body will continue to go through many changes during pregnancy. The changes vary from woman to woman. During the third trimester:  Your weight will continue to increase. You can expect to gain 25-35 pounds (11-16 kg) by the end of the pregnancy.  You may begin to get stretch marks on your hips, abdomen, and breasts.  You may urinate more often because the fetus is moving lower into your pelvis and pressing on your bladder.  You may develop or continue to have heartburn. This is caused by increased hormones that slow down muscles in the digestive tract.  You may develop or continue to have constipation because increased hormones slow digestion and cause the muscles that push waste through your intestines to relax.  You may develop hemorrhoids. These are swollen veins (varicose veins) in the rectum that can itch or be painful.  You may develop swollen, bulging veins (varicose veins) in your legs.  You may have increased body aches in the pelvis, back, or thighs. This is due to weight gain and increased hormones that are relaxing your joints.  You may have changes in your hair. These can include thickening of your hair, rapid growth, and changes in texture. Some women also have hair loss during or after pregnancy, or hair that feels dry or thin. Your hair will most likely return to normal after your baby is born.  Your breasts will continue to grow and they will continue to become tender. A yellow fluid (colostrum) may leak from your breasts. This is the first milk you are producing for your baby.  Your belly button may stick out.  You may notice more swelling in your  hands, face, or ankles.  You may have increased tingling or numbness in your hands, arms, and legs. The skin on your belly may also feel numb.  You may feel short of breath because of your expanding uterus.  You may have more problems sleeping. This can be caused by the size of your belly, increased need to urinate, and an increase in your body's metabolism.  You may notice the fetus "dropping," or moving lower in your abdomen (lightening).  You may have increased vaginal discharge.  You may notice your joints feel loose and you may have pain around your pelvic bone.  What to expect at prenatal visits You will have prenatal exams every 2 weeks until week 36. Then you will have weekly prenatal exams. During a routine prenatal visit:  You will be weighed to make sure you and the baby are growing normally.  Your blood pressure will be taken.  Your abdomen will be measured to track your baby's growth.  The fetal heartbeat will be listened to.  Any test results from the previous visit will be discussed.  You may have a cervical check near your due date to see if your cervix has softened or thinned (effaced).  You will be tested for Group B streptococcus. This happens between 35 and 37 weeks.  Your health care provider may ask you:  What your birth plan is.  How you are feeling.  If you are feeling the baby move.  If you have   had any abnormal symptoms, such as leaking fluid, bleeding, severe headaches, or abdominal cramping.  If you are using any tobacco products, including cigarettes, chewing tobacco, and electronic cigarettes.  If you have any questions.  Other tests or screenings that may be performed during your third trimester include:  Blood tests that check for low iron levels (anemia).  Fetal testing to check the health, activity level, and growth of the fetus. Testing is done if you have certain medical conditions or if there are problems during the  pregnancy.  Nonstress test (NST). This test checks the health of your baby to make sure there are no signs of problems, such as the baby not getting enough oxygen. During this test, a belt is placed around your belly. The baby is made to move, and its heart rate is monitored during movement.  What is false labor? False labor is a condition in which you feel small, irregular tightenings of the muscles in the womb (contractions) that usually go away with rest, changing position, or drinking water. These are called Braxton Hicks contractions. Contractions may last for hours, days, or even weeks before true labor sets in. If contractions come at regular intervals, become more frequent, increase in intensity, or become painful, you should see your health care provider. What are the signs of labor?  Abdominal cramps.  Regular contractions that start at 10 minutes apart and become stronger and more frequent with time.  Contractions that start on the top of the uterus and spread down to the lower abdomen and back.  Increased pelvic pressure and dull back pain.  A watery or bloody mucus discharge that comes from the vagina.  Leaking of amniotic fluid. This is also known as your "water breaking." It could be a slow trickle or a gush. Let your health care provider know if it has a color or strange odor. If you have any of these signs, call your health care provider right away, even if it is before your due date. Follow these instructions at home: Medicines  Follow your health care provider's instructions regarding medicine use. Specific medicines may be either safe or unsafe to take during pregnancy.  Take a prenatal vitamin that contains at least 600 micrograms (mcg) of folic acid.  If you develop constipation, try taking a stool softener if your health care provider approves. Eating and drinking  Eat a balanced diet that includes fresh fruits and vegetables, whole grains, good sources of protein  such as meat, eggs, or tofu, and low-fat dairy. Your health care provider will help you determine the amount of weight gain that is right for you.  Avoid raw meat and uncooked cheese. These carry germs that can cause birth defects in the baby.  If you have low calcium intake from food, talk to your health care provider about whether you should take a daily calcium supplement.  Eat four or five small meals rather than three large meals a day.  Limit foods that are high in fat and processed sugars, such as fried and sweet foods.  To prevent constipation: ? Drink enough fluid to keep your urine clear or pale yellow. ? Eat foods that are high in fiber, such as fresh fruits and vegetables, whole grains, and beans. Activity  Exercise only as directed by your health care provider. Most women can continue their usual exercise routine during pregnancy. Try to exercise for 30 minutes at least 5 days a week. Stop exercising if you experience uterine contractions.  Avoid   heavy lifting.  Do not exercise in extreme heat or humidity, or at high altitudes.  Wear low-heel, comfortable shoes.  Practice good posture.  You may continue to have sex unless your health care provider tells you otherwise. Relieving pain and discomfort  Take frequent breaks and rest with your legs elevated if you have leg cramps or low back pain.  Take warm sitz baths to soothe any pain or discomfort caused by hemorrhoids. Use hemorrhoid cream if your health care provider approves.  Wear a good support bra to prevent discomfort from breast tenderness.  If you develop varicose veins: ? Wear support pantyhose or compression stockings as told by your healthcare provider. ? Elevate your feet for 15 minutes, 3-4 times a day. Prenatal care  Write down your questions. Take them to your prenatal visits.  Keep all your prenatal visits as told by your health care provider. This is important. Safety  Wear your seat belt at  all times when driving.  Make a list of emergency phone numbers, including numbers for family, friends, the hospital, and police and fire departments. General instructions  Avoid cat litter boxes and soil used by cats. These carry germs that can cause birth defects in the baby. If you have a cat, ask someone to clean the litter box for you.  Do not travel far distances unless it is absolutely necessary and only with the approval of your health care provider.  Do not use hot tubs, steam rooms, or saunas.  Do not drink alcohol.  Do not use any products that contain nicotine or tobacco, such as cigarettes and e-cigarettes. If you need help quitting, ask your health care provider.  Do not use any medicinal herbs or unprescribed drugs. These chemicals affect the formation and growth of the baby.  Do not douche or use tampons or scented sanitary pads.  Do not cross your legs for long periods of time.  To prepare for the arrival of your baby: ? Take prenatal classes to understand, practice, and ask questions about labor and delivery. ? Make a trial run to the hospital. ? Visit the hospital and tour the maternity area. ? Arrange for maternity or paternity leave through employers. ? Arrange for family and friends to take care of pets while you are in the hospital. ? Purchase a rear-facing car seat and make sure you know how to install it in your car. ? Pack your hospital bag. ? Prepare the baby's nursery. Make sure to remove all pillows and stuffed animals from the baby's crib to prevent suffocation.  Visit your dentist if you have not gone during your pregnancy. Use a soft toothbrush to brush your teeth and be gentle when you floss. Contact a health care provider if:  You are unsure if you are in labor or if your water has broken.  You become dizzy.  You have mild pelvic cramps, pelvic pressure, or nagging pain in your abdominal area.  You have lower back pain.  You have persistent  nausea, vomiting, or diarrhea.  You have an unusual or bad smelling vaginal discharge.  You have pain when you urinate. Get help right away if:  Your water breaks before 37 weeks.  You have regular contractions less than 5 minutes apart before 37 weeks.  You have a fever.  You are leaking fluid from your vagina.  You have spotting or bleeding from your vagina.  You have severe abdominal pain or cramping.  You have rapid weight loss or weight   gain.  You have shortness of breath with chest pain.  You notice sudden or extreme swelling of your face, hands, ankles, feet, or legs.  Your baby makes fewer than 10 movements in 2 hours.  You have severe headaches that do not go away when you take medicine.  You have vision changes. Summary  The third trimester is from week 28 through week 40, months 7 through 9. The third trimester is a time when the unborn baby (fetus) is growing rapidly.  During the third trimester, your discomfort may increase as you and your baby continue to gain weight. You may have abdominal, leg, and back pain, sleeping problems, and an increased need to urinate.  During the third trimester your breasts will keep growing and they will continue to become tender. A yellow fluid (colostrum) may leak from your breasts. This is the first milk you are producing for your baby.  False labor is a condition in which you feel small, irregular tightenings of the muscles in the womb (contractions) that eventually go away. These are called Braxton Hicks contractions. Contractions may last for hours, days, or even weeks before true labor sets in.  Signs of labor can include: abdominal cramps; regular contractions that start at 10 minutes apart and become stronger and more frequent with time; watery or bloody mucus discharge that comes from the vagina; increased pelvic pressure and dull back pain; and leaking of amniotic fluid. This information is not intended to replace advice  given to you by your health care provider. Make sure you discuss any questions you have with your health care provider. Document Released: 03/08/2001 Document Revised: 08/20/2015 Document Reviewed: 05/15/2012 [employer] Interactive Patient Education  2017 [employer]   Breastfeeding Deciding to breastfeed is one of the best choices you can make for you and your baby. A change in hormones during pregnancy causes your breast tissue to grow and increases the number and size of your milk ducts. These hormones also allow proteins, sugars, and fats from your blood supply to make breast milk in your milk-producing glands. Hormones prevent breast milk from being released before your baby is born as well as prompt milk flow after birth. Once breastfeeding has begun, thoughts of your baby, as well as his or her sucking or crying, can stimulate the release of milk from your milk-producing glands. Benefits of breastfeeding For Your Baby  Your first milk (colostrum) helps your baby's digestive system function better.  There are antibodies in your milk that help your baby fight off infections.  Your baby has a lower incidence of asthma, allergies, and sudden infant death syndrome.  The nutrients in breast milk are better for your baby than infant formulas and are designed uniquely for your baby's needs.  Breast milk improves your baby's brain development.  Your baby is less likely to develop other conditions, such as childhood obesity, asthma, or type 2 diabetes mellitus.  For You  Breastfeeding helps to create a very special bond between you and your baby.  Breastfeeding is convenient. Breast milk is always available at the correct temperature and costs nothing.  Breastfeeding helps to burn calories and helps you lose the weight gained during pregnancy.  Breastfeeding makes your uterus contract to its prepregnancy size faster and slows bleeding (lochia) after you give birth.  Breastfeeding helps  to lower your risk of developing type 2 diabetes mellitus, osteoporosis, and breast or ovarian cancer later in life.  Signs that your baby is hungry Early Signs of Hunger    Increased alertness or activity.  Stretching.  Movement of the head from side to side.  Movement of the head and opening of the mouth when the corner of the mouth or cheek is stroked (rooting).  Increased sucking sounds, smacking lips, cooing, sighing, or squeaking.  Hand-to-mouth movements.  Increased sucking of fingers or hands.  Late Signs of Hunger  Fussing.  Intermittent crying.  Extreme Signs of Hunger Signs of extreme hunger will require calming and consoling before your baby will be able to breastfeed successfully. Do not wait for the following signs of extreme hunger to occur before you initiate breastfeeding:  Restlessness.  A loud, strong cry.  Screaming.  Breastfeeding basics Breastfeeding Initiation  Find a comfortable place to sit or lie down, with your neck and back well supported.  Place a pillow or rolled up blanket under your baby to bring him or her to the level of your breast (if you are seated). Nursing pillows are specially designed to help support your arms and your baby while you breastfeed.  Make sure that your baby's abdomen is facing your abdomen.  Gently massage your breast. With your fingertips, massage from your chest wall toward your nipple in a circular motion. This encourages milk flow. You may need to continue this action during the feeding if your milk flows slowly.  Support your breast with 4 fingers underneath and your thumb above your nipple. Make sure your fingers are well away from your nipple and your baby's mouth.  Stroke your baby's lips gently with your finger or nipple.  When your baby's mouth is open wide enough, quickly bring your baby to your breast, placing your entire nipple and as much of the colored area around your nipple (areola) as possible into  your baby's mouth. ? More areola should be visible above your baby's upper lip than below the lower lip. ? Your baby's tongue should be between his or her lower gum and your breast.  Ensure that your baby's mouth is correctly positioned around your nipple (latched). Your baby's lips should create a seal on your breast and be turned out (everted).  It is common for your baby to suck about 2-3 minutes in order to start the flow of breast milk.  Latching Teaching your baby how to latch on to your breast properly is very important. An improper latch can cause nipple pain and decreased milk supply for you and poor weight gain in your baby. Also, if your baby is not latched onto your nipple properly, he or she may swallow some air during feeding. This can make your baby fussy. Burping your baby when you switch breasts during the feeding can help to get rid of the air. However, teaching your baby to latch on properly is still the best way to prevent fussiness from swallowing air while breastfeeding. Signs that your baby has successfully latched on to your nipple:  Silent tugging or silent sucking, without causing you pain.  Swallowing heard between every 3-4 sucks.  Muscle movement above and in front of his or her ears while sucking.  Signs that your baby has not successfully latched on to nipple:  Sucking sounds or smacking sounds from your baby while breastfeeding.  Nipple pain.  If you think your baby has not latched on correctly, slip your finger into the corner of your baby's mouth to break the suction and place it between your baby's gums. Attempt breastfeeding initiation again. Signs of Successful Breastfeeding Signs from your baby:  A   gradual decrease in the number of sucks or complete cessation of sucking.  Falling asleep.  Relaxation of his or her body.  Retention of a small amount of milk in his or her mouth.  Letting go of your breast by himself or herself.  Signs from  you:  Breasts that have increased in firmness, weight, and size 1-3 hours after feeding.  Breasts that are softer immediately after breastfeeding.  Increased milk volume, as well as a change in milk consistency and color by the fifth day of breastfeeding.  Nipples that are not sore, cracked, or bleeding.  Signs That Your Baby is Getting Enough Milk  Wetting at least 1-2 diapers during the first 24 hours after birth.  Wetting at least 5-6 diapers every 24 hours for the first week after birth. The urine should be clear or pale yellow by 5 days after birth.  Wetting 6-8 diapers every 24 hours as your baby continues to grow and develop.  At least 3 stools in a 24-hour period by age 5 days. The stool should be soft and yellow.  At least 3 stools in a 24-hour period by age 7 days. The stool should be seedy and yellow.  No loss of weight greater than 10% of birth weight during the first 3 days of age.  Average weight gain of 4-7 ounces (113-198 g) per week after age 4 days.  Consistent daily weight gain by age 5 days, without weight loss after the age of 2 weeks.  After a feeding, your baby may spit up a small amount. This is common. Breastfeeding frequency and duration Frequent feeding will help you make more milk and can prevent sore nipples and breast engorgement. Breastfeed when you feel the need to reduce the fullness of your breasts or when your baby shows signs of hunger. This is called "breastfeeding on demand." Avoid introducing a pacifier to your baby while you are working to establish breastfeeding (the first 4-6 weeks after your baby is born). After this time you may choose to use a pacifier. Research has shown that pacifier use during the first year of a baby's life decreases the risk of sudden infant death syndrome (SIDS). Allow your baby to feed on each breast as long as he or she wants. Breastfeed until your baby is finished feeding. When your baby unlatches or falls asleep  while feeding from the first breast, offer the second breast. Because newborns are often sleepy in the first few weeks of life, you may need to awaken your baby to get him or her to feed. Breastfeeding times will vary from baby to baby. However, the following rules can serve as a guide to help you ensure that your baby is properly fed:  Newborns (babies 4 weeks of age or younger) may breastfeed every 1-3 hours.  Newborns should not go longer than 3 hours during the day or 5 hours during the night without breastfeeding.  You should breastfeed your baby a minimum of 8 times in a 24-hour period until you begin to introduce solid foods to your baby at around 6 months of age.  Breast milk pumping Pumping and storing breast milk allows you to ensure that your baby is exclusively fed your breast milk, even at times when you are unable to breastfeed. This is especially important if you are going back to work while you are still breastfeeding or when you are not able to be present during feedings. Your lactation consultant can give you guidelines on how   long it is safe to store breast milk. A breast pump is a machine that allows you to pump milk from your breast into a sterile bottle. The pumped breast milk can then be stored in a refrigerator or freezer. Some breast pumps are operated by hand, while others use electricity. Ask your lactation consultant which type will work best for you. Breast pumps can be purchased, but some hospitals and breastfeeding support groups lease breast pumps on a monthly basis. A lactation consultant can teach you how to hand express breast milk, if you prefer not to use a pump. Caring for your breasts while you breastfeed Nipples can become dry, cracked, and sore while breastfeeding. The following recommendations can help keep your breasts moisturized and healthy:  Avoid using soap on your nipples.  Wear a supportive bra. Although not required, special nursing bras and tank  tops are designed to allow access to your breasts for breastfeeding without taking off your entire bra or top. Avoid wearing underwire-style bras or extremely tight bras.  Air dry your nipples for 3-4minutes after each feeding.  Use only cotton bra pads to absorb leaked breast milk. Leaking of breast milk between feedings is normal.  Use lanolin on your nipples after breastfeeding. Lanolin helps to maintain your skin's normal moisture barrier. If you use pure lanolin, you do not need to wash it off before feeding your baby again. Pure lanolin is not toxic to your baby. You may also hand express a few drops of breast milk and gently massage that milk into your nipples and allow the milk to air dry.  In the first few weeks after giving birth, some women experience extremely full breasts (engorgement). Engorgement can make your breasts feel heavy, warm, and tender to the touch. Engorgement peaks within 3-5 days after you give birth. The following recommendations can help ease engorgement:  Completely empty your breasts while breastfeeding or pumping. You may want to start by applying warm, moist heat (in the shower or with warm water-soaked hand towels) just before feeding or pumping. This increases circulation and helps the milk flow. If your baby does not completely empty your breasts while breastfeeding, pump any extra milk after he or she is finished.  Wear a snug bra (nursing or regular) or tank top for 1-2 days to signal your body to slightly decrease milk production.  Apply ice packs to your breasts, unless this is too uncomfortable for you.  Make sure that your baby is latched on and positioned properly while breastfeeding.  If engorgement persists after 48 hours of following these recommendations, contact your health care provider or a lactation consultant. Overall health care recommendations while breastfeeding  Eat healthy foods. Alternate between meals and snacks, eating 3 of each per  day. Because what you eat affects your breast milk, some of the foods may make your baby more irritable than usual. Avoid eating these foods if you are sure that they are negatively affecting your baby.  Drink milk, fruit juice, and water to satisfy your thirst (about 10 glasses a day).  Rest often, relax, and continue to take your prenatal vitamins to prevent fatigue, stress, and anemia.  Continue breast self-awareness checks.  Avoid chewing and smoking tobacco. Chemicals from cigarettes that pass into breast milk and exposure to secondhand smoke may harm your baby.  Avoid alcohol and drug use, including marijuana. Some medicines that may be harmful to your baby can pass through breast milk. It is important to ask your health care   provider before taking any medicine, including all over-the-counter and prescription medicine as well as vitamin and herbal supplements. It is possible to become pregnant while breastfeeding. If birth control is desired, ask your health care provider about options that will be safe for your baby. Contact a health care provider if:  You feel like you want to stop breastfeeding or have become frustrated with breastfeeding.  You have painful breasts or nipples.  Your nipples are cracked or bleeding.  Your breasts are red, tender, or warm.  You have a swollen area on either breast.  You have a fever or chills.  You have nausea or vomiting.  You have drainage other than breast milk from your nipples.  Your breasts do not become full before feedings by the fifth day after you give birth.  You feel sad and depressed.  Your baby is too sleepy to eat well.  Your baby is having trouble sleeping.  Your baby is wetting less than 3 diapers in a 24-hour period.  Your baby has less than 3 stools in a 24-hour period.  Your baby's skin or the white part of his or her eyes becomes yellow.  Your baby is not gaining weight by 5 days of age. Get help right away  if:  Your baby is overly tired (lethargic) and does not want to wake up and feed.  Your baby develops an unexplained fever. This information is not intended to replace advice given to you by your health care provider. Make sure you discuss any questions you have with your health care provider. Document Released: 03/14/2005 Document Revised: 08/26/2015 Document Reviewed: 09/05/2012 Elsevier Interactive Patient Education  2017 Elsevier Inc.  

## 2016-06-02 LAB — CBC
HEMATOCRIT: 30.9 % — AB (ref 34.0–46.6)
Hemoglobin: 10.6 g/dL — ABNORMAL LOW (ref 11.1–15.9)
MCH: 29 pg (ref 26.6–33.0)
MCHC: 34.3 g/dL (ref 31.5–35.7)
MCV: 85 fL (ref 79–97)
Platelets: 253 10*3/uL (ref 150–379)
RBC: 3.65 x10E6/uL — ABNORMAL LOW (ref 3.77–5.28)
RDW: 14.6 % (ref 12.3–15.4)
WBC: 6.9 10*3/uL (ref 3.4–10.8)

## 2016-06-02 LAB — RPR: RPR Ser Ql: NONREACTIVE

## 2016-06-02 LAB — GLUCOSE, RANDOM: Glucose: 91 mg/dL (ref 65–99)

## 2016-06-02 LAB — HIV ANTIBODY (ROUTINE TESTING W REFLEX): HIV Screen 4th Generation wRfx: NONREACTIVE

## 2016-06-30 ENCOUNTER — Encounter: Payer: 59 | Admitting: Obstetrics & Gynecology

## 2016-07-04 ENCOUNTER — Ambulatory Visit (INDEPENDENT_AMBULATORY_CARE_PROVIDER_SITE_OTHER): Payer: 59 | Admitting: Obstetrics and Gynecology

## 2016-07-04 VITALS — BP 128/86 | HR 101 | Wt 191.0 lb

## 2016-07-04 DIAGNOSIS — Z3403 Encounter for supervision of normal first pregnancy, third trimester: Secondary | ICD-10-CM | POA: Diagnosis not present

## 2016-07-04 DIAGNOSIS — Z3400 Encounter for supervision of normal first pregnancy, unspecified trimester: Secondary | ICD-10-CM

## 2016-07-04 DIAGNOSIS — D573 Sickle-cell trait: Secondary | ICD-10-CM

## 2016-07-04 DIAGNOSIS — Z3493 Encounter for supervision of normal pregnancy, unspecified, third trimester: Secondary | ICD-10-CM

## 2016-07-04 NOTE — Progress Notes (Signed)
Prenatal Visit Note Date: 07/04/2016 Clinic: [facility]  Subjective:  Mckenzie Nelson is a 26 y.o. G1P0 at [redacted]w[redacted]d being seen today for ongoing prenatal care.  She is currently monitored for the following issues for this low-risk pregnancy and has Supervision of normal first pregnancy, antepartum; Sickle cell trait ([place]); and Marijuana use on her problem list.  Patient reports no complaints.   Contractions: Not present. Vag. Bleeding: None.  Movement: Present. Denies leaking of fluid.   The following portions of the patient's history were reviewed and updated as appropriate: allergies, current medications, past family history, past medical history, past social history, past surgical history and problem list. Problem list updated.  Objective:   Vitals:   07/04/16 1553  BP: 128/86  Pulse: (!) 101  Weight: 191 lb (86.6 kg)    Fetal Status: Fetal Heart Rate (bpm): 135 Fundal Height: 32 cm Movement: Present  Presentation: Vertex  General:  Alert, oriented and cooperative. Patient is in no acute distress.  Skin: Skin is warm and dry. No rash noted.   Cardiovascular: Normal heart rate noted  Respiratory: Normal respiratory effort, no problems with respiration noted  Abdomen: Soft, gravid, appropriate for gestational age. Pain/Pressure: Absent     Pelvic:  Cervical exam deferred        Extremities: Normal range of motion.  Edema: None  Mental Status: Normal mood and affect. Normal behavior. Normal judgment and thought content.   Urinalysis:      Assessment and Plan:  Pregnancy: G1P0 at [redacted]w[redacted]d  1. Encounter for supervision of normal pregnancy in third trimester, unspecified gravidity Will do fasting glucose and a1c as discussed at other visits for GDM screening - Glucose - HgB A1c  2. Sickle cell trait (HCC) UCx nv  Preterm labor symptoms and general obstetric precautions including but not limited to vaginal bleeding, contractions, leaking of fluid and fetal  movement were reviewed in detail with the patient. Please refer to After Visit Summary for other counseling recommendations.  RTC: per babyscripts schedule   Aletha Halim, MD

## 2016-07-05 LAB — GLUCOSE, RANDOM: Glucose: 70 mg/dL (ref 65–99)

## 2016-07-05 LAB — HEMOGLOBIN A1C
ESTIMATED AVERAGE GLUCOSE: 88 mg/dL
HEMOGLOBIN A1C: 4.7 % — AB (ref 4.8–5.6)

## 2016-07-06 ENCOUNTER — Encounter: Payer: Self-pay | Admitting: *Deleted

## 2016-07-08 ENCOUNTER — Encounter: Payer: Self-pay | Admitting: *Deleted

## 2016-08-01 ENCOUNTER — Other Ambulatory Visit (HOSPITAL_COMMUNITY)
Admission: RE | Admit: 2016-08-01 | Discharge: 2016-08-01 | Disposition: A | Discharge status: Home / Self Care | Payer: 59 | Source: Ambulatory Visit | Attending: Obstetrics & Gynecology | Admitting: Obstetrics & Gynecology

## 2016-08-01 ENCOUNTER — Ambulatory Visit (INDEPENDENT_AMBULATORY_CARE_PROVIDER_SITE_OTHER): Payer: 59 | Admitting: Obstetrics & Gynecology

## 2016-08-01 VITALS — BP 126/82 | HR 102 | Wt 196.0 lb

## 2016-08-01 DIAGNOSIS — Z3400 Encounter for supervision of normal first pregnancy, unspecified trimester: Secondary | ICD-10-CM | POA: Diagnosis not present

## 2016-08-01 DIAGNOSIS — Z3403 Encounter for supervision of normal first pregnancy, third trimester: Secondary | ICD-10-CM

## 2016-08-01 DIAGNOSIS — Z3A00 Weeks of gestation of pregnancy not specified: Secondary | ICD-10-CM | POA: Diagnosis not present

## 2016-08-01 DIAGNOSIS — Z113 Encounter for screening for infections with a predominantly sexual mode of transmission: Secondary | ICD-10-CM

## 2016-08-01 LAB — OB RESULTS CONSOLE GBS: GBS: NEGATIVE

## 2016-08-01 NOTE — Progress Notes (Signed)
   PRENATAL VISIT NOTE  Subjective:  Mckenzie Nelson is a 26 y.o. G1P0 at [redacted]w[redacted]d being seen today for ongoing prenatal care.  She is currently monitored for the following issues for this low-risk pregnancy and has Supervision of normal first pregnancy, antepartum; Sickle cell trait ([place]); and Marijuana use on her problem list.  Patient reports no complaints.  Contractions: Not present. Vag. Bleeding: None.  Movement: Present. Denies leaking of fluid.   The following portions of the patient's history were reviewed and updated as appropriate: allergies, current medications, past family history, past medical history, past social history, past surgical history and problem list. Problem list updated.  Objective:   Vitals:   08/01/16 1550  BP: 126/82  Pulse: (!) 102  Weight: 196 lb (88.9 kg)    Fetal Status: Fetal Heart Rate (bpm): 143 Fundal Height: 37 cm Movement: Present  Presentation: Vertex  General:  Alert, oriented and cooperative. Patient is in no acute distress.  Skin: Skin is warm and dry. No rash noted.   Cardiovascular: Normal heart rate noted  Respiratory: Normal respiratory effort, no problems with respiration noted  Abdomen: Soft, gravid, appropriate for gestational age. Pain/Pressure: Absent     Pelvic:  Cervical exam performed Dilation: 1 Effacement (%): 40 Station: Ballotable  Extremities: Normal range of motion.  Edema: None  Mental Status: Normal mood and affect. Normal behavior. Normal judgment and thought content.   Assessment and Plan:  Pregnancy: G1P0 at 108w5d  1. Supervision of normal first pregnancy, antepartum Pelvic cultures done today. - Strep Gp B NAA - GC/Chlamydia probe amp ([place])not at [facility]  Preterm labor symptoms and general obstetric precautions including but not limited to vaginal bleeding, contractions, leaking of fluid and fetal movement were reviewed in detail with the patient. Please refer to After Visit Summary for other counseling  recommendations.  Return in about 2 weeks (around 08/15/2016) for OB 38 week visit (Babyscripts).   Osborne Oman, MD

## 2016-08-01 NOTE — Patient Instructions (Addendum)
Return to clinic for any scheduled appointments or obstetric concerns, or go to MAU for evaluation   [facility] [place] [address], [address] [place], [place]  [address] Phone - [phone]   Fax - [phone]  [facility] [address] [place], [place] [address] Phone - [phone]   Fax - [place] [address] [place], [place]  [address] Phone - [phone]   Fax - [phone]  [place] [address] [place] [address] [place], [place]  [address] Phone - [phone]   Fax - [phone]  [place] [address] [place], [place]  [address] Phone - [phone]   Fax - [phone]  [facility] [address] [place], [place]  [address] Phone - [phone]   Fax - [place] [address], [place], [place]  [address] Phone - [phone]   Fax - [phone]  [place] [address] [place], [place] [address] [place], [place]  [address] Phone - [phone]   Fax - [place] [address] [place], [place]  [address] Phone - [phone]   Fax - [phone] [facility] [address] Peterson. [address] [place], [place]  [address] Phone - [phone]   Fax - [phone]  [place] [address] [place], [place]  [address] Phone - [phone]   Fax - [phone]  [facility] [place], [place], [place]  [address] Phone - [phone]   Fax - [place] [address], [place], [place]  [address] Phone - [phone]   Fax - [phone]  [facility] [address], [place], [place]  [address] Phone - [place], [place]  [address] Phone - [phone]   Fax - [place] [address] [place], [place]  [address] Phone - [phone]   Fax - [phone]  [place] [address] [place], [place]  [address] Phone - [phone]   Fax - [place], [place]  [address] Phone - [phone]   Fax - [place], [place], [place]  [address] Phone - [phone]   Fax - [place] [address] [place], [place]  [address] Phone - [phone]   Fax - [phone]  [place] [address] [place], [place]  [address] Phone - [phone]   Fax - [place] [address] [place], [place]  [address] Phone - [phone]   Fax - [phone]  [place] [address] [place], [place]  [address] Phone - [phone]   Fax - [phone] [place] [address] [place], [place]  [address] Phone - [phone]   Fax - [place] [address] [place], [place]  [address] Phone - [phone]   Fax - [place] [address], [place], [place]  [address] Phone - [phone]   Fax - [phone]  Bertha MD [address] [place] [address] Phone [phone]  Fax [phone]

## 2016-08-03 LAB — GC/CHLAMYDIA PROBE AMP (~~LOC~~) NOT AT ARMC
CHLAMYDIA, DNA PROBE: NEGATIVE
Neisseria Gonorrhea: NEGATIVE

## 2016-08-03 LAB — STREP GP B NAA: Strep Gp B NAA: NEGATIVE

## 2016-08-12 ENCOUNTER — Encounter (HOSPITAL_COMMUNITY): Payer: Self-pay

## 2016-08-12 ENCOUNTER — Inpatient Hospital Stay (HOSPITAL_COMMUNITY)
Admission: AD | Admit: 2016-08-12 | Discharge: 2016-08-12 | Disposition: A | Discharge status: Home / Self Care | Payer: 59 | Source: Ambulatory Visit | Attending: Obstetrics & Gynecology | Admitting: Obstetrics & Gynecology

## 2016-08-12 DIAGNOSIS — O26893 Other specified pregnancy related conditions, third trimester: Secondary | ICD-10-CM | POA: Diagnosis not present

## 2016-08-12 DIAGNOSIS — Z3400 Encounter for supervision of normal first pregnancy, unspecified trimester: Secondary | ICD-10-CM

## 2016-08-12 DIAGNOSIS — O9989 Other specified diseases and conditions complicating pregnancy, childbirth and the puerperium: Secondary | ICD-10-CM

## 2016-08-12 DIAGNOSIS — Z3A37 37 weeks gestation of pregnancy: Secondary | ICD-10-CM | POA: Diagnosis not present

## 2016-08-12 DIAGNOSIS — N898 Other specified noninflammatory disorders of vagina: Secondary | ICD-10-CM

## 2016-08-12 LAB — POCT FERN TEST: POCT Fern Test: NEGATIVE

## 2016-08-12 NOTE — MAU Provider Note (Signed)
RN Note: Hen she woke up this morning, some liquid was dripping.  Does not have a pad on , none noted since shower.   S: Ms. Mckenzie Nelson is a 26 y.o. G1P0000 at [redacted]w[redacted]d  who presents to MAU today complaining of leaking of fluid since morning. She denies vaginal bleeding. She denies contractions. She reports normal fetal movement.    O: BP 129/71 (BP Location: Right Arm)   Pulse 91   Temp 98.4 F (36.9 C) (Oral)   Resp 18   Wt 197 lb 4 oz (89.5 kg)   LMP 11/25/2015   SpO2 100%   BMI 33.86 kg/m  GENERAL: Well-developed, well-nourished female in no acute distress.  HEAD: Normocephalic, atraumatic.  CHEST: Normal effort of breathing, regular heart rate ABDOMEN: Soft, nontender, gravid PELVIC: Normal external female genitalia. Vagina is pink and rugated. Cervix with normal contour, no lesions. Normal discharge.  no pooling.   Cervical exam:      Fetal Monitoring: Baseline: 135 Variability: average Accelerations: present Decelerations: absent Contractions: occasional  Results for orders placed or performed during the hospital encounter of 08/12/16 (from the past 24 hour(s))  Fern Test     Status: None   Collection Time: 08/12/16 11:10 AM  Result Value Ref Range   POCT Fern Test Negative = intact amniotic membranes      A: SIUP at [redacted]w[redacted]d  Membranes intact  P: Discharge home Keep appt in office as scheduled  Seabron Spates, CNM 08/12/2016 11:31 AM

## 2016-08-12 NOTE — Discharge Instructions (Signed)

## 2016-08-12 NOTE — MAU Note (Signed)
Hen she woke up this morning, some liquid was dripping.  Does not have a pad on , none noted since shower.

## 2016-08-12 NOTE — MAU Note (Signed)
Urine in lab 

## 2016-08-15 ENCOUNTER — Ambulatory Visit (INDEPENDENT_AMBULATORY_CARE_PROVIDER_SITE_OTHER): Payer: 59 | Admitting: Obstetrics and Gynecology

## 2016-08-15 VITALS — BP 114/76 | HR 80 | Wt 198.0 lb

## 2016-08-15 DIAGNOSIS — Z3400 Encounter for supervision of normal first pregnancy, unspecified trimester: Secondary | ICD-10-CM

## 2016-08-15 DIAGNOSIS — Z3403 Encounter for supervision of normal first pregnancy, third trimester: Secondary | ICD-10-CM

## 2016-08-15 DIAGNOSIS — D573 Sickle-cell trait: Secondary | ICD-10-CM

## 2016-08-15 NOTE — Progress Notes (Signed)
Prenatal Visit Note Date: 08/15/2016 Clinic: [facility]  Subjective:  Mckenzie Nelson is a 26 y.o. G1P0000 at [redacted]w[redacted]d being seen today for ongoing prenatal care.  She is currently monitored for the following issues for this low-risk pregnancy and has Supervision of normal first pregnancy, antepartum; Sickle cell trait ([place]); and Marijuana use on her problem list.  Patient reports no complaints.   Contractions: Irritability. Vag. Bleeding: None.  Movement: Present. Denies leaking of fluid.   The following portions of the patient's history were reviewed and updated as appropriate: allergies, current medications, past family history, past medical history, past social history, past surgical history and problem list. Problem list updated.  Objective:   Vitals:   08/15/16 1525  BP: 114/76  Pulse: 80  Weight: 198 lb (89.8 kg)    Fetal Status: Fetal Heart Rate (bpm): 133 Fundal Height: 38 cm Movement: Present  Presentation: Vertex  General:  Alert, oriented and cooperative. Patient is in no acute distress.  Skin: Skin is warm and dry. No rash noted.   Cardiovascular: Normal heart rate noted  Respiratory: Normal respiratory effort, no problems with respiration noted  Abdomen: Soft, gravid, appropriate for gestational age. Pain/Pressure: Absent     Pelvic:  Cervical exam deferred        Extremities: Normal range of motion.  Edema: None  Mental Status: Normal mood and affect. Normal behavior. Normal judgment and thought content.   Urinalysis:      Assessment and Plan:  Pregnancy: G1P0000 at [redacted]w[redacted]d  1. Supervision of normal first pregnancy, antepartum Routine care  2. Sickle cell trait (HCC) Surveillance ucx today - Culture, OB Urine  Term labor symptoms and general obstetric precautions including but not limited to vaginal bleeding, contractions, leaking of fluid and fetal movement were reviewed in detail with the patient. Please refer to After Visit Summary for  other counseling recommendations.  Return in about 1 week (around 08/22/2016) for 7-10d rob.   Aletha Halim, MD

## 2016-08-19 ENCOUNTER — Encounter: Payer: Self-pay | Admitting: *Deleted

## 2016-08-21 ENCOUNTER — Inpatient Hospital Stay (HOSPITAL_COMMUNITY): Payer: 59 | Admitting: Anesthesiology

## 2016-08-21 ENCOUNTER — Encounter (HOSPITAL_COMMUNITY): Payer: Self-pay

## 2016-08-21 ENCOUNTER — Inpatient Hospital Stay (HOSPITAL_COMMUNITY)
Admission: AD | Admit: 2016-08-21 | Discharge: 2016-08-21 | Disposition: A | Discharge status: Home / Self Care | Payer: 59 | Source: Ambulatory Visit | Attending: Obstetrics and Gynecology | Admitting: Obstetrics and Gynecology

## 2016-08-21 ENCOUNTER — Inpatient Hospital Stay (HOSPITAL_COMMUNITY)
Admission: AD | Admit: 2016-08-21 | Discharge: 2016-08-23 | DRG: 775 | Disposition: A | Discharge status: Home / Self Care | Payer: 59 | Source: Ambulatory Visit | Attending: Obstetrics & Gynecology | Admitting: Obstetrics & Gynecology

## 2016-08-21 DIAGNOSIS — Z833 Family history of diabetes mellitus: Secondary | ICD-10-CM | POA: Diagnosis not present

## 2016-08-21 DIAGNOSIS — Z3493 Encounter for supervision of normal pregnancy, unspecified, third trimester: Secondary | ICD-10-CM | POA: Diagnosis present

## 2016-08-21 DIAGNOSIS — O9902 Anemia complicating childbirth: Secondary | ICD-10-CM | POA: Diagnosis present

## 2016-08-21 DIAGNOSIS — O479 False labor, unspecified: Secondary | ICD-10-CM | POA: Diagnosis not present

## 2016-08-21 DIAGNOSIS — Z3400 Encounter for supervision of normal first pregnancy, unspecified trimester: Secondary | ICD-10-CM

## 2016-08-21 DIAGNOSIS — Z87891 Personal history of nicotine dependence: Secondary | ICD-10-CM

## 2016-08-21 DIAGNOSIS — D573 Sickle-cell trait: Secondary | ICD-10-CM | POA: Diagnosis present

## 2016-08-21 DIAGNOSIS — Z8249 Family history of ischemic heart disease and other diseases of the circulatory system: Secondary | ICD-10-CM | POA: Diagnosis not present

## 2016-08-21 DIAGNOSIS — Z3A38 38 weeks gestation of pregnancy: Secondary | ICD-10-CM

## 2016-08-21 HISTORY — DX: False labor, unspecified: O47.9

## 2016-08-21 LAB — CBC
HCT: 29.7 % — ABNORMAL LOW (ref 36.0–46.0)
Hemoglobin: 10.2 g/dL — ABNORMAL LOW (ref 12.0–15.0)
MCH: 25.1 pg — AB (ref 26.0–34.0)
MCHC: 34.3 g/dL (ref 30.0–36.0)
MCV: 73.2 fL — AB (ref 78.0–100.0)
PLATELETS: 254 10*3/uL (ref 150–400)
RBC: 4.06 MIL/uL (ref 3.87–5.11)
RDW: 16.6 % — AB (ref 11.5–15.5)
WBC: 7.5 10*3/uL (ref 4.0–10.5)

## 2016-08-21 LAB — TYPE AND SCREEN
ABO/RH(D): B POS
Antibody Screen: NEGATIVE
Weak D: POSITIVE

## 2016-08-21 LAB — ABO/RH: ABO/RH(D): B POS

## 2016-08-21 MED ORDER — OXYCODONE-ACETAMINOPHEN 5-325 MG PO TABS: 2.0000 | ORAL_TABLET | ORAL | Status: DC | PRN

## 2016-08-21 MED ORDER — ONDANSETRON HCL 4 MG/2ML IJ SOLN: 4.0000 mg | Freq: Four times a day (QID) | INTRAMUSCULAR | Status: DC | PRN

## 2016-08-21 MED ORDER — DIPHENHYDRAMINE HCL 50 MG/ML IJ SOLN: 12.5000 mg | INTRAMUSCULAR | Status: DC | PRN

## 2016-08-21 MED ORDER — EPHEDRINE 5 MG/ML INJ
10.0000 mg | INTRAVENOUS | Status: DC | PRN
  Filled 2016-08-21: qty 2

## 2016-08-21 MED ORDER — ACETAMINOPHEN 325 MG PO TABS: 650.0000 mg | ORAL_TABLET | ORAL | Status: DC | PRN

## 2016-08-21 MED ORDER — LACTATED RINGERS IV SOLN: 500.0000 mL | Freq: Once | INTRAVENOUS | Status: DC

## 2016-08-21 MED ORDER — OXYTOCIN 40 UNITS IN LACTATED RINGERS INFUSION - SIMPLE MED
1.0000 m[IU]/min | INTRAVENOUS | Status: DC
  Filled 2016-08-21: qty 1000

## 2016-08-21 MED ORDER — FENTANYL CITRATE (PF) 100 MCG/2ML IJ SOLN
100.0000 ug | INTRAMUSCULAR | Status: DC | PRN
  Administered 2016-08-21 (×2): 100 ug via INTRAVENOUS
  Filled 2016-08-21 (×3): qty 2

## 2016-08-21 MED ORDER — LIDOCAINE HCL (PF) 1 % IJ SOLN
INTRAMUSCULAR | Status: DC | PRN
  Administered 2016-08-21: 4 mL via EPIDURAL

## 2016-08-21 MED ORDER — OXYCODONE-ACETAMINOPHEN 5-325 MG PO TABS: 1.0000 | ORAL_TABLET | ORAL | Status: DC | PRN

## 2016-08-21 MED ORDER — LIDOCAINE HCL (PF) 1 % IJ SOLN
30.0000 mL | INTRAMUSCULAR | Status: DC | PRN
  Filled 2016-08-21: qty 30

## 2016-08-21 MED ORDER — FENTANYL 2.5 MCG/ML BUPIVACAINE 1/10 % EPIDURAL INFUSION (WH - ANES)
14.0000 mL/h | INTRAMUSCULAR | Status: DC | PRN
  Administered 2016-08-21 – 2016-08-22 (×2): 14 mL/h via EPIDURAL
  Filled 2016-08-21 (×2): qty 100

## 2016-08-21 MED ORDER — LACTATED RINGERS IV SOLN
500.0000 mL | INTRAVENOUS | Status: DC | PRN
  Administered 2016-08-22: 500 mL via INTRAVENOUS

## 2016-08-21 MED ORDER — SOD CITRATE-CITRIC ACID 500-334 MG/5ML PO SOLN: 30.0000 mL | ORAL | Status: DC | PRN

## 2016-08-21 MED ORDER — LACTATED RINGERS IV SOLN
500.0000 mL | Freq: Once | INTRAVENOUS | Status: AC
  Administered 2016-08-21: 500 mL via INTRAVENOUS

## 2016-08-21 MED ORDER — FLEET ENEMA 7-19 GM/118ML RE ENEM: 1.0000 | ENEMA | Freq: Every day | RECTAL | Status: DC | PRN

## 2016-08-21 MED ORDER — LACTATED RINGERS IV SOLN
INTRAVENOUS | Status: DC
  Administered 2016-08-21: 13:00:00 via INTRAVENOUS

## 2016-08-21 MED ORDER — OXYTOCIN 40 UNITS IN LACTATED RINGERS INFUSION - SIMPLE MED
1.0000 m[IU]/min | INTRAVENOUS | Status: DC
  Administered 2016-08-21: 2 m[IU]/min via INTRAVENOUS

## 2016-08-21 MED ORDER — OXYTOCIN BOLUS FROM INFUSION
500.0000 mL | Freq: Once | INTRAVENOUS | Status: AC
  Administered 2016-08-22: 500 mL via INTRAVENOUS

## 2016-08-21 MED ORDER — EPHEDRINE 5 MG/ML INJ: 10.0000 mg | INTRAVENOUS | Status: DC | PRN

## 2016-08-21 MED ORDER — PHENYLEPHRINE 40 MCG/ML (10ML) SYRINGE FOR IV PUSH (FOR BLOOD PRESSURE SUPPORT)
80.0000 ug | PREFILLED_SYRINGE | INTRAVENOUS | Status: DC | PRN
  Filled 2016-08-21: qty 10
  Filled 2016-08-21: qty 5

## 2016-08-21 MED ORDER — PHENYLEPHRINE 40 MCG/ML (10ML) SYRINGE FOR IV PUSH (FOR BLOOD PRESSURE SUPPORT)
80.0000 ug | PREFILLED_SYRINGE | INTRAVENOUS | Status: DC | PRN
  Filled 2016-08-21: qty 5

## 2016-08-21 MED ORDER — PHENYLEPHRINE 40 MCG/ML (10ML) SYRINGE FOR IV PUSH (FOR BLOOD PRESSURE SUPPORT): 80.0000 ug | PREFILLED_SYRINGE | INTRAVENOUS | Status: DC | PRN

## 2016-08-21 MED ORDER — OXYTOCIN 40 UNITS IN LACTATED RINGERS INFUSION - SIMPLE MED
2.5000 [IU]/h | INTRAVENOUS | Status: DC
  Filled 2016-08-21: qty 1000

## 2016-08-21 MED ORDER — ZOLPIDEM TARTRATE 5 MG PO TABS
5.0000 mg | ORAL_TABLET | Freq: Once | ORAL | Status: AC
  Administered 2016-08-21: 5 mg via ORAL
  Filled 2016-08-21: qty 1

## 2016-08-21 MED ORDER — TERBUTALINE SULFATE 1 MG/ML IJ SOLN
0.2500 mg | Freq: Once | INTRAMUSCULAR | Status: DC | PRN
  Filled 2016-08-21: qty 1

## 2016-08-21 NOTE — MAU Note (Signed)
Was 1 cm at last exam.  Contractions since 11 pm. No leaking.  No bleeding. Baby moving.

## 2016-08-21 NOTE — Progress Notes (Signed)
S: Patient seen & examined for progress of labor. Patient complains of pain/pressure. Not interested in epidural, said will think about nitrous. Has not gotten any pain medications.  O:  Vitals:   08/21/16 1125 08/21/16 1242 08/21/16 1244 08/21/16 1351  BP:    126/80  Pulse:    (!) 106  Resp:  16 16 16   Temp:  98.2 F (36.8 C)    TempSrc:  Axillary    SpO2: 99%     Weight:  89.4 kg (197 lb)    Height:  5\' 4"  (1.626 m)      Dilation: 3 Effacement (%): 60 Cervical Position: Anterior Station: -3 Presentation: Vertex Exam by:: harraway smith   FHT: 120 bpm, mod var, +accels, no decels TOCO: irregular   A/P: Pain: Not well controlled but patient declines pain meds at this time Labor: Patient declines pitocin, asks to wait 30 mins, will recheck cervix at that time FWB: Category I tracing  Continue expectant management Anticipate SVD

## 2016-08-21 NOTE — Anesthesia Procedure Notes (Signed)
Epidural Patient location during procedure: OB Start time: 08/21/2016 5:46 PM End time: 08/21/2016 5:54 PM  Staffing Anesthesiologist: Suella Broad D Performed: anesthesiologist   Preanesthetic Checklist Completed: patient identified, site marked, surgical consent, pre-op evaluation, timeout performed, IV checked, risks and benefits discussed and monitors and equipment checked  Epidural Patient position: sitting Prep: ChloraPrep Patient monitoring: heart rate, continuous pulse ox and blood pressure Approach: midline Location: L3-L4 Injection technique: LOR saline  Needle:  Needle type: Tuohy  Needle gauge: 17 G Needle length: 9 cm Catheter type: closed end flexible Catheter size: 20 Guage Test dose: negative and 1.5% lidocaine  Assessment Events: blood not aspirated, injection not painful, no injection resistance and no paresthesia  Additional Notes LOR @ 6  Patient identified. Risks/Benefits/Options discussed with patient including but not limited to bleeding, infection, nerve damage, paralysis, failed block, incomplete pain control, headache, blood pressure changes, nausea, vomiting, reactions to medications, itching and postpartum back pain. Confirmed with bedside nurse the patient's most recent platelet count. Confirmed with patient that they are not currently taking any anticoagulation, have any bleeding history or any family history of bleeding disorders. Patient expressed understanding and wished to proceed. All questions were answered. Sterile technique was used throughout the entire procedure. Please see nursing notes for vital signs. Test dose was given through epidural catheter and negative prior to continuing to dose epidural or start infusion. Warning signs of high block given to the patient including shortness of breath, tingling/numbness in hands, complete motor block, or any concerning symptoms with instructions to call for help. Patient was given instructions on fall  risk and not to get out of bed. All questions and concerns addressed with instructions to call with any issues or inadequate analgesia.    Reason for block:procedure for pain

## 2016-08-21 NOTE — Anesthesia Preprocedure Evaluation (Signed)
Anesthesia Evaluation  Patient identified by MRN, date of birth, ID band Patient awake    Reviewed: Allergy & Precautions, Patient's Chart, lab work & pertinent test results  Airway Mallampati: II       Dental no notable dental hx.    Pulmonary former smoker,    Pulmonary exam normal        Cardiovascular negative cardio ROS Normal cardiovascular exam     Neuro/Psych negative neurological ROS  negative psych ROS   GI/Hepatic negative GI ROS, Neg liver ROS,   Endo/Other  negative endocrine ROS  Renal/GU negative Renal ROS  negative genitourinary   Musculoskeletal negative musculoskeletal ROS (+)   Abdominal   Peds negative pediatric ROS (+)  Hematology negative hematology ROS (+)   Anesthesia Other Findings   Reproductive/Obstetrics (+) Pregnancy                             Lab Results  Component Value Date   WBC 7.5 08/21/2016   HGB 10.2 (L) 08/21/2016   HCT 29.7 (L) 08/21/2016   MCV 73.2 (L) 08/21/2016   PLT 254 08/21/2016     Anesthesia Physical Anesthesia Plan  ASA: II  Anesthesia Plan: Epidural   Post-op Pain Management:    Induction:   Airway Management Planned:   Additional Equipment:   Intra-op Plan:   Post-operative Plan:   Informed Consent: I have reviewed the patients History and Physical, chart, labs and discussed the procedure including the risks, benefits and alternatives for the proposed anesthesia with the patient or authorized representative who has indicated his/her understanding and acceptance.     Plan Discussed with:   Anesthesia Plan Comments:         Anesthesia Quick Evaluation

## 2016-08-21 NOTE — Anesthesia Pain Management Evaluation Note (Signed)
  CRNA Pain Management Visit Note  Patient: Mckenzie Nelson, 26 y.o., female  "Hello I am a member of the anesthesia team at [facility]. We have an anesthesia team available at all times to provide care throughout the hospital, including epidural management and anesthesia for C-section. I don't know your plan for the delivery whether it a natural birth, water birth, IV sedation, nitrous supplementation, doula or epidural, but we want to meet your pain goals."   1.Was your pain managed to your expectations on prior hospitalizations?   No prior hospitalizations  2.What is your expectation for pain management during this hospitalization?     IV pain meds  3.How can we help you reach that goal? Iv pain rx  Record the patient's initial score and the patient's pain goal.   Pain: 3  Pain Goal: 6 [facility] wants you to be able to say your pain was always managed very well.  Farrel Guimond 08/21/2016

## 2016-08-21 NOTE — Progress Notes (Addendum)
G1@ 38.[redacted] wksga. Presents to traige for ctx since yesterdy at 2300. Pt released this morning @ 03 and ba k for the same issue. Denies LOF or bleeding. +FM. EFM applied. Maternal tachy. 137/85 100 O2 sat RA and HR 118.   SVE: 3.5/50/BB  1145: faculty notified. Report given. No orders  1157: Dr. Burr Medico at bs along w Dr. Maylene Roes. Orders received to admit.   1207: birthing suite charge nurse notified. Report given. Room assigned to 169  1220: labs drawn and sent to lab. Pt to birthing suiie via wheelchair taken by tech.

## 2016-08-21 NOTE — MAU Note (Signed)
I have communicated with Laury Deep CNM  and reviewed vital signs:  Vitals:   08/21/16 0246 08/21/16 0453  BP: 113/73 129/84  Pulse: (!) 107 (!) 108  Resp: 18 18  Temp: 98 F (36.7 C) 98.5 F (36.9 C)    Vaginal exam:  Dilation: 2.5 Effacement (%): 80 Cervical Position: Middle Station: -2 Presentation: Vertex Exam by:: Brylee Berk Production designer, theatre/television/film,   Also reviewed contraction pattern and that non-stress test is reactive.  It has been documented that patient is contracting every 4-7 minutes with minimal cervical change over an hour not indicating active labor.  Patient denies any other complaints.  Based on this report provider has given order for discharge.  A discharge order and diagnosis entered by a provider.   Labor discharge instructions reviewed with patient.

## 2016-08-21 NOTE — Progress Notes (Addendum)
S: Patient seen & examined for progress of labor. Patient agreeable to starting pitocin, requests epidural.  AROM @1720  with light meconium.  O:  Vitals:   08/21/16 1351 08/21/16 1447 08/21/16 1607 08/21/16 1608  BP: 126/80 126/72  122/62  Pulse: (!) 106 99  94  Resp: 16 18  16   Temp:   98.2 F (36.8 C)   TempSrc:   Oral   SpO2:      Weight:      Height:        Dilation: 4 Effacement (%): 90 Cervical Position: Anterior Station: -1 Presentation: Vertex Exam by:: Jill Alexanders RN   FHT: 120 bpm, mod var, +accels, no decels TOCO: q49min   A/P: Labor: Starting pitocin @1700  Pain: manageable with IV meds, epidural on request FWB: Category I Tracing  Continue expectant management Anticipate SVD

## 2016-08-21 NOTE — H&P (Signed)
LABOR AND DELIVERY ADMISSION HISTORY AND PHYSICAL NOTE  Mckenzie Nelson is a 26 y.o. female G1P0000 with IUP at [redacted]w[redacted]d by LMP, confirmed with 11 week ultrasound, presenting for SOL. She reports +FM, + contractions, No LOF, no VB, no blurry vision, headaches or peripheral edema, and RUQ pain.  She plans on breast feeding. She is still deciding on birth control after delivery.  Dating: By LMP/11 week ultrasound --->  Estimated Date of Delivery: 08/31/16   Prenatal History/Complications:  Past Medical History: Past Medical History:  Diagnosis Date  . Cellulitis and abscess of buttock 03/12/2013  . False labor 08/21/2016  . Pilonidal cyst     Past Surgical History: Past Surgical History:  Procedure Laterality Date  . NO PAST SURGERIES      Obstetrical History: OB History    Gravida Para Term Preterm AB Living   1 0 0 0 0 0   SAB TAB Ectopic Multiple Live Births   0 0 0 0 0      Social History: Social History   Social History  . Marital status: Single    Spouse name: N/A  . Number of children: N/A  . Years of education: N/A   Occupational History  . [employer]    Social History Main Topics  . Smoking status: Former Research scientist (life sciences)  . Smokeless tobacco: Never Used  . Alcohol use No  . Drug use: No  . Sexual activity: Yes    Partners: Male    Birth control/ protection: None   Other Topics Concern  . None   Social History Narrative  . None    Family History: Family History  Problem Relation Age of Onset  . Diabetes Maternal Grandmother   . Stroke Maternal Grandmother   . Emphysema Maternal Grandfather   . Heart disease Maternal Grandfather     Allergies: No Known Allergies  No prescriptions prior to admission.    Review of Systems   All systems reviewed and negative except as stated in HPI  Physical Exam Blood pressure 137/85, pulse (!) 123, temperature 98.4 F (36.9 C), temperature source Oral, resp. rate 18, last menstrual period 11/25/2015, SpO2 99  %. General appearance: alert, cooperative and appears stated age Lungs: clear to auscultation bilaterally Heart: regular rate and rhythm Abdomen: soft, non-tender; bowel sounds normal Extremities: No calf swelling or tenderness  Fetal monitoring: 120 bmp, moderate variability, accels, no decels Uterine activity: contractions q57m Dilation: 3 Effacement (%): 60 Station: -3 Exam by:: harraway smith   Prenatal labs: ABO, Rh: B/POS/-- (11/16 1007) Antibody: NEG (11/16 1007) Rubella: Immune RPR: Non Reactive (03/07 1017)  HBsAg: NEGATIVE (11/16 1007)  HIV: Non Reactive (03/07 1017)  GBS: Negative (05/07 1613)  1 hr Glucola: normal  Genetic screening:  declined Anatomy US: normal  Prenatal Transfer Tool  Maternal Diabetes: No Genetic Screening: Declined Maternal Ultrasounds/Referrals: Fibroid uterus, otherwise WNL Fetal Ultrasounds or other Referrals:  None Maternal Substance Abuse:  No Significant Maternal Medications:  None Significant Maternal Lab Results: Lab values include: Group B Strep negative  No results found for this or any previous visit (from the past 24 hour(s)).  Patient Active Problem List   Diagnosis Date Noted  . False labor 08/21/2016  . Marijuana use 02/13/2016  . Supervision of normal first pregnancy, antepartum 02/11/2016  . Sickle cell trait ([place]) 02/11/2016    Assessment: Mckenzie Nelson is a 26 y.o. G1P0000 at [redacted]w[redacted]d here for SOL with   #Labor: spontaneous onset #Pain: Considering nitrous, pain moderately controlled  at this time #FWB: Category I tracing #ID:  GBS negative #MOF: breast #MOC: undecided #Circ:  yes  Everrett Coombe, MD PGY-1 [facility]  08/21/2016, 12:34 PM

## 2016-08-22 ENCOUNTER — Encounter (HOSPITAL_COMMUNITY): Payer: Self-pay | Admitting: *Deleted

## 2016-08-22 DIAGNOSIS — Z3A38 38 weeks gestation of pregnancy: Secondary | ICD-10-CM

## 2016-08-22 LAB — RPR: RPR Ser Ql: NONREACTIVE

## 2016-08-22 MED ORDER — IBUPROFEN 600 MG PO TABS
600.0000 mg | ORAL_TABLET | Freq: Four times a day (QID) | ORAL | Status: DC
  Administered 2016-08-22 – 2016-08-23 (×5): 600 mg via ORAL
  Filled 2016-08-22 (×5): qty 1

## 2016-08-22 MED ORDER — ACETAMINOPHEN 325 MG PO TABS: 650.0000 mg | ORAL_TABLET | ORAL | Status: DC | PRN

## 2016-08-22 MED ORDER — BENZOCAINE-MENTHOL 20-0.5 % EX AERO: 1.0000 "application " | INHALATION_SPRAY | CUTANEOUS | Status: DC | PRN

## 2016-08-22 MED ORDER — DIPHENHYDRAMINE HCL 25 MG PO CAPS: 25.0000 mg | ORAL_CAPSULE | Freq: Four times a day (QID) | ORAL | Status: DC | PRN

## 2016-08-22 MED ORDER — COCONUT OIL OIL: 1.0000 "application " | TOPICAL_OIL | Status: DC | PRN

## 2016-08-22 MED ORDER — ZOLPIDEM TARTRATE 5 MG PO TABS: 5.0000 mg | ORAL_TABLET | Freq: Every evening | ORAL | Status: DC | PRN

## 2016-08-22 MED ORDER — OXYCODONE HCL 5 MG PO TABS: 10.0000 mg | ORAL_TABLET | ORAL | Status: DC | PRN

## 2016-08-22 MED ORDER — TETANUS-DIPHTH-ACELL PERTUSSIS 5-2.5-18.5 LF-MCG/0.5 IM SUSP: 0.5000 mL | Freq: Once | INTRAMUSCULAR | Status: DC

## 2016-08-22 MED ORDER — PRENATAL MULTIVITAMIN CH
1.0000 | ORAL_TABLET | Freq: Every day | ORAL | Status: DC
  Filled 2016-08-22 (×2): qty 1

## 2016-08-22 MED ORDER — SIMETHICONE 80 MG PO CHEW: 80.0000 mg | CHEWABLE_TABLET | ORAL | Status: DC | PRN

## 2016-08-22 MED ORDER — ONDANSETRON HCL 4 MG PO TABS: 4.0000 mg | ORAL_TABLET | ORAL | Status: DC | PRN

## 2016-08-22 MED ORDER — DIBUCAINE 1 % RE OINT: 1.0000 "application " | TOPICAL_OINTMENT | RECTAL | Status: DC | PRN

## 2016-08-22 MED ORDER — OXYCODONE HCL 5 MG PO TABS: 5.0000 mg | ORAL_TABLET | ORAL | Status: DC | PRN

## 2016-08-22 MED ORDER — SENNOSIDES-DOCUSATE SODIUM 8.6-50 MG PO TABS
2.0000 | ORAL_TABLET | ORAL | Status: DC
  Filled 2016-08-22: qty 2

## 2016-08-22 MED ORDER — WITCH HAZEL-GLYCERIN EX PADS: 1.0000 "application " | MEDICATED_PAD | CUTANEOUS | Status: DC | PRN

## 2016-08-22 MED ORDER — ONDANSETRON HCL 4 MG/2ML IJ SOLN: 4.0000 mg | INTRAMUSCULAR | Status: DC | PRN

## 2016-08-22 NOTE — Anesthesia Postprocedure Evaluation (Signed)
Anesthesia Post Note  Patient: Mckenzie Nelson  Procedure(s) Performed: * No procedures listed *  Patient location during evaluation: Mother Baby Anesthesia Type: Epidural Level of consciousness: awake and alert and oriented Pain management: pain level controlled Vital Signs Assessment: post-procedure vital signs reviewed and stable Respiratory status: spontaneous breathing and nonlabored ventilation Cardiovascular status: stable Postop Assessment: no headache, patient able to bend at knees, no backache, no signs of nausea or vomiting, epidural receding and adequate PO intake Anesthetic complications: no        Last Vitals:  Vitals:   08/22/16 0920 08/22/16 1023  BP: 113/71 113/69  Pulse: 80 81  Resp: 18 18  Temp: 37.4 C 36.7 C    Last Pain:  Vitals:   08/22/16 1023  TempSrc: Oral  PainSc:    Pain Goal: Patients Stated Pain Goal: 4 (08/22/16 0552)               Jabier Mutton

## 2016-08-22 NOTE — Progress Notes (Signed)
S: Patient doing well. Reports feeling pressure.   O:  Vitals:   08/21/16 2207 08/21/16 2230 08/21/16 2300 08/21/16 2330  BP: (!) 145/89 128/78 125/71 114/61  Pulse: (!) 119 100 (!) 121 (!) 107  Resp:      Temp:      TempSrc:      SpO2:      Weight:      Height:        Dilation: 6 Effacement (%): 90 Cervical Position: Middle Station: -1 Presentation: Vertex Exam by:: Mindy Trogdon RN AROM 1720 light meconium  FHT: 135 bpm, mod var, +accels, no decels TOCO: q18min   A/P: Labor: Progressing normally with pitocin Pain: Epidural FWB: Category I Tracing  Continue expectant management Anticipate SVD  Adin Hector, MD, MPH PGY-2 [facility]

## 2016-08-22 NOTE — Lactation Note (Signed)
This note was copied from a baby's chart. Lactation Consultation Note  Patient Name: Mckenzie Nelson Date: 08/22/2016 Reason for consult: Initial assessment   Initial assessment with first time mom of 60 hour old infant. Infant has attempted to feed x 2 without success per mom. Mom plans to BF for 6 months. She reports + breast changes with pregnancy.   Mom with large compressible breasts and areola with everted nipples. Mom was able to hand express large gtts colostrum.   Infant in crib sleeping when [name] entered room, he began stirring. LC changed infant diaper and he was noted to have feeding cues. Placed infant STS with mom. Assisted mom in latching infant to left breast in the football hold. He initially latched shallowly and mom reported pain, infant removed from the breast and relatched. Mom reported no pain with this latch. Infant fed for 10 minutes while LC present, he was still feeding when [name] left room. Infant noted to have flanged kips, rhythmic suckles and frequent intermittent swallows. Mom did well supporting infant and massaging breast throughout feeding.   Enc mom to feed infant STS 8-12 x in 24 hours at first feeding cues using both breasts with each feeding. Reviewed hand expression with mom and enc prior to each feeding and after feeding. Discussed awakening techniques and to offer second breast with each feeding. Enc mom to use good pillow and head support throughout feeding. Reviewed spoon feeding and to use if infant sleepy and will not latch to breast. Reviewed colostrum, milk coming to volume, cluster feeding, and NB nutritional needs and feeding behaviors.   BF Resources Handout and [facility] brochure given, mom informed of IP/OP services, BF Support Groups and [place] phone #. Enc mom to call out for feeding assistance as needed. Mom and GM without further questions/concerns at this time.    Maternal Data Formula Feeding for Exclusion: No Has patient been taught Hand  Expression?: Yes Does the patient have breastfeeding experience prior to this delivery?: No  Feeding Feeding Type: Breast Fed Length of feed: 10 min (still feeding when LC left room. )  LATCH Score/Interventions Latch: Grasps breast easily, tongue down, lips flanged, rhythmical sucking.  Audible Swallowing: Spontaneous and intermittent  Type of Nipple: Everted at rest and after stimulation  Comfort (Breast/Nipple): Soft / non-tender     Hold (Positioning): Assistance needed to correctly position infant at breast and maintain latch. Intervention(s): Breastfeeding basics reviewed;Support Pillows;Position options;Skin to skin  LATCH Score: 9  Lactation Tools Discussed/Used WIC Program: No   Consult Status Consult Status: Follow-up Date: 08/23/16 Follow-up type: In-patient    [name] [name] 08/22/2016, 3:06 PM

## 2016-08-22 NOTE — Progress Notes (Signed)
S: Patient seen and examined for progress of labor. Patient reporting good pain relief with epidural but does endorse pressure felt primarily in lower back.  O:  Vitals:   08/22/16 0100 08/22/16 0130 08/22/16 0133 08/22/16 0200  BP: 125/68 129/71  127/75  Pulse: (!) 108 (!) 114  (!) 122  Resp:   16   Temp:      TempSrc:      SpO2:   97%   Weight:      Height:        Dilation: 6 Effacement (%): 90 Cervical Position: Middle Station: -1 Presentation: Vertex Exam by:: Mindy Trogdon RN AROM 1720 light meconium  FHT: 140 bpm, mod var, +accels, no decels TOCO: q7min   A/P: Labor: Progressing normally with pitocin Pain: Epidural FWB: Category I Tracing  Continue expectant management Anticipate SVD  Adin Hector, MD, MPH PGY-2 [facility]

## 2016-08-23 LAB — CBC
HCT: 22.9 % — ABNORMAL LOW (ref 36.0–46.0)
Hemoglobin: 8.2 g/dL — ABNORMAL LOW (ref 12.0–15.0)
MCH: 25.9 pg — ABNORMAL LOW (ref 26.0–34.0)
MCHC: 35.8 g/dL (ref 30.0–36.0)
MCV: 72.5 fL — AB (ref 78.0–100.0)
PLATELETS: 211 10*3/uL (ref 150–400)
RBC: 3.16 MIL/uL — ABNORMAL LOW (ref 3.87–5.11)
RDW: 16.8 % — AB (ref 11.5–15.5)
WBC: 17.1 10*3/uL — AB (ref 4.0–10.5)

## 2016-08-23 MED ORDER — IBUPROFEN 600 MG PO TABS: 600.0000 mg | ORAL_TABLET | Freq: Four times a day (QID) | ORAL | 0 refills | Status: DC

## 2016-08-23 NOTE — Lactation Note (Signed)
This note was copied from a baby's chart. Lactation Consultation Note  Patient Name: Mckenzie Nelson BMZTA'E Date: 08/23/2016 Reason for consult: Follow-up assessment Baby at 26 hr of life and dyad set for d/c today. Upon entry mom reported that she can not remember how to get a deep latch. Assisted her with positioning baby in football. Baby latched easily with bottom lip flanged, top lip does not flange as easily. Showed mom how to do breast compressions and stimulate baby to suck when he was falling asleep. Discussed baby behavior, feeding frequency, baby belly size, voids, wt loss, breast changes, and nipple care. Mom is aware of lactation services and support group. She will call as needed.    Maternal Data    Feeding Feeding Type: Breast Fed Length of feed: 20 min  LATCH Score/Interventions Latch: Grasps breast easily, tongue down, lips flanged, rhythmical sucking.  Audible Swallowing: A few with stimulation Intervention(s): Skin to skin;Hand expression  Type of Nipple: Everted at rest and after stimulation  Comfort (Breast/Nipple): Soft / non-tender     Hold (Positioning): Assistance needed to correctly position infant at breast and maintain latch. Intervention(s): Position options;Support Pillows  LATCH Score: 8  Lactation Tools Discussed/Used WIC Program:  (plans to call [facility] today )   Consult Status Consult Status: Complete Follow-up type: Call as needed    Denzil Hughes 08/23/2016, 10:34 AM

## 2016-08-23 NOTE — Progress Notes (Signed)
CSW received consult for hx of marijuana use.  Referral was screened out due to the following: ~MOB had no documented substance use after initial prenatal visit/+UPT. ~MOB had no positive drug screens after initial prenatal visit/+UPT. ~Baby's UDS is negative.  Please consult CSW if current concerns arise or by MOB's request.  CSW will monitor CDS results and make report to Child Protective Services if warranted. 

## 2016-08-23 NOTE — Discharge Summary (Signed)
OB Discharge Summary     Patient Name: Mckenzie Nelson DOB: [birth year] MRN: [id number]  Date of admission: 08/21/2016 Delivering MD: Verner Mould   Date of discharge: 08/23/2016  Admitting diagnosis: 38 WKS, CTXS Intrauterine pregnancy: [redacted]w[redacted]d     Secondary diagnosis:  Active Problems:   Normal labor  Additional problems: sickle cell trait     Discharge diagnosis: Term Pregnancy Delivered                                                                                                Post partum procedures:none  Augmentation: AROM and Pitocin  Complications: None  Hospital course:  Onset of Labor With Vaginal Delivery     26 y.o. yo G1P1001 at [redacted]w[redacted]d was admitted in Latent Labor on 08/21/2016. Patient had an uncomplicated labor course as follows:  Membrane Rupture Time/Date: 5:18 PM ,08/21/2016   Intrapartum Procedures: Episiotomy: None [1]                                         Lacerations:     Patient had a delivery of a Viable infant. 08/22/2016  Information for the patient's newborn:  Terrie, Haring [[id number]]  Delivery Method: Vaginal, Spontaneous Delivery (Filed from Delivery Summary)    Pateint had an uncomplicated postpartum course.  She is ambulating, tolerating a regular diet, passing flatus, and urinating well. Patient is discharged home in stable condition on 08/23/16.   Physical exam  Vitals:   08/22/16 0920 08/22/16 1023 08/22/16 1600 08/23/16 0500  BP: 113/71 113/69 116/60 103/67  Pulse: 80 81 98 99  Resp: 18 18 16 18   Temp: 99.3 F (37.4 C) 98.1 F (36.7 C) 98 F (36.7 C) 97.5 F (36.4 C)  TempSrc: Axillary Oral Oral Oral  SpO2: 100%  99% 99%  Weight:      Height:       General: alert, cooperative and no distress Lochia: appropriate Uterine Fundus: firm Incision: N/A DVT Evaluation: No evidence of DVT seen on physical exam. Negative Homan's sign. No cords or calf tenderness. Labs: Lab Results  Component Value Date   WBC  17.1 (H) 08/23/2016   HGB 8.2 (L) 08/23/2016   HCT 22.9 (L) 08/23/2016   MCV 72.5 (L) 08/23/2016   PLT 211 08/23/2016   CMP Latest Ref Rng & Units 07/04/2016  Glucose 65 - 99 mg/dL 70    Discharge instruction: per After Visit Summary and "Baby and Me Booklet".  After visit meds:  Allergies as of 08/23/2016   No Known Allergies     Medication List    TAKE these medications   ibuprofen 600 MG tablet Commonly known as:  ADVIL,MOTRIN Take 1 tablet (600 mg total) by mouth every 6 (six) hours.       Diet: routine diet  Activity: Advance as tolerated. Pelvic rest for 6 weeks.   Outpatient follow up:4-5 weeks Follow up Appt:No future appointments. Follow up Visit:No Follow-up on file.  Postpartum contraception: None  Newborn Data: Live  born female  Birth Weight: 8 lb 13.1 oz (4000 g) APGAR: 9, 9  Baby Feeding: Breast Disposition:home with mother   08/23/2016 Silas [place], MD

## 2016-08-25 ENCOUNTER — Encounter: Payer: 59 | Admitting: Obstetrics and Gynecology

## 2016-10-03 ENCOUNTER — Ambulatory Visit (INDEPENDENT_AMBULATORY_CARE_PROVIDER_SITE_OTHER): Payer: Medicaid Other | Admitting: Family Medicine

## 2016-10-03 ENCOUNTER — Encounter: Payer: Self-pay | Admitting: Family Medicine

## 2016-10-03 NOTE — Progress Notes (Signed)
Post Partum Exam  Mckenzie Nelson is a 26 y.o. G26P1001 female who presents for a postpartum visit. She is 6 weeks postpartum following a spontaneous vaginal delivery. I have fully reviewed the prenatal and intrapartum course. The delivery was at 38.5 gestational weeks.  Anesthesia: epidural. Postpartum course has been unremarkable. Baby's course has been unremarkable. Baby is feeding by breast. Bleeding thin lochia. Bowel function is normal. Bladder function is normal. Patient is not sexually active. Contraception method is condoms. Postpartum depression screening:neg  The following portions of the patient's history were reviewed and updated as appropriate: allergies, current medications, past family history, past medical history, past social history, past surgical history and problem list.  Review of Systems Pertinent items are noted in HPI.    Objective:  unknown if currently breastfeeding.  General:  alert, cooperative and no distress  Lungs: clear to auscultation bilaterally  Heart:  regular rate and rhythm, S1, S2 normal, no murmur, click, rub or gallop  Abdomen: soft, non-tender; bowel sounds normal; no masses,  no organomegaly        Assessment:    normal postpartum exam. Pap smear not done at today's visit.   Plan:   1. Contraception: condoms 2.  Follow up in: 1 year or as needed.

## 2017-01-30 ENCOUNTER — Other Ambulatory Visit: Payer: Self-pay

## 2017-01-30 ENCOUNTER — Emergency Department (HOSPITAL_COMMUNITY)
Admission: EM | Admit: 2017-01-30 | Discharge: 2017-01-30 | Disposition: A | Discharge status: Home / Self Care | Payer: 59 | Attending: Emergency Medicine | Admitting: Emergency Medicine

## 2017-01-30 ENCOUNTER — Encounter (HOSPITAL_COMMUNITY): Payer: Self-pay | Admitting: *Deleted

## 2017-01-30 DIAGNOSIS — L02411 Cutaneous abscess of right axilla: Secondary | ICD-10-CM | POA: Insufficient documentation

## 2017-01-30 DIAGNOSIS — L0291 Cutaneous abscess, unspecified: Secondary | ICD-10-CM

## 2017-01-30 DIAGNOSIS — Z87891 Personal history of nicotine dependence: Secondary | ICD-10-CM | POA: Insufficient documentation

## 2017-01-30 DIAGNOSIS — R2231 Localized swelling, mass and lump, right upper limb: Secondary | ICD-10-CM | POA: Diagnosis present

## 2017-01-30 MED ORDER — SULFAMETHOXAZOLE-TRIMETHOPRIM 800-160 MG PO TABS: 1.0000 | ORAL_TABLET | Freq: Two times a day (BID) | ORAL | 0 refills | Status: AC

## 2017-01-30 MED ORDER — LIDOCAINE-EPINEPHRINE (PF) 2 %-1:200000 IJ SOLN
10.0000 mL | Freq: Once | INTRAMUSCULAR | Status: AC
  Administered 2017-01-30: 10 mL
  Filled 2017-01-30: qty 20

## 2017-01-30 MED ORDER — ACETAMINOPHEN 500 MG PO TABS
1000.0000 mg | ORAL_TABLET | Freq: Four times a day (QID) | ORAL | 0 refills | Status: DC | PRN
Start: 2017-01-30 — End: 2017-12-08

## 2017-01-30 NOTE — ED Triage Notes (Signed)
Abscess under her rt arm for one week  She has had previously.  Oct 18th

## 2017-01-30 NOTE — ED Provider Notes (Signed)
[facility] EMERGENCY DEPARTMENT Provider Note   CSN: [id number] Arrival date & time: 01/30/17  0257     History   Chief Complaint Chief Complaint  Patient presents with  . Recurrent Skin Infections    HPI Mckenzie Nelson is a 26 y.o. female.  HPI Patient with history of recurrent skin infections comes in with chief complaint of boil. Patient reports having axillary boil for about 1 week now.  Over time her boil has increased in size.  Patient has no drainage.  Patient is not having any nausea, vomiting, fevers or chills.  Patient is breast-feeding.  Past Medical History:  Diagnosis Date  . Cellulitis and abscess of buttock 03/12/2013  . False labor 08/21/2016  . Pilonidal cyst     Patient Active Problem List   Diagnosis Date Noted  . Marijuana use 02/13/2016  . Sickle cell trait ([place]) 02/11/2016    Past Surgical History:  Procedure Laterality Date  . NO PAST SURGERIES      OB History    Gravida Para Term Preterm AB Living   1 1 1  0 0 1   SAB TAB Ectopic Multiple Live Births   0 0 0 0 1       Home Medications    Prior to Admission medications   Medication Sig Start Date End Date Taking? Authorizing Provider  acetaminophen (TYLENOL) 325 MG tablet Take 650 mg every 6 (six) hours as needed by mouth for mild pain, moderate pain, fever or headache.   Yes [provider]  acetaminophen (TYLENOL) 500 MG tablet Take 2 tablets (1,000 mg total) every 6 (six) hours as needed by mouth. 01/30/17   Charlesetta Shanks, MD  ibuprofen (ADVIL,MOTRIN) 600 MG tablet Take 1 tablet (600 mg total) by mouth every 6 (six) hours. Patient not taking: Reported on 01/30/2017 08/23/16   Guss Bunde, MD  sulfamethoxazole-trimethoprim (BACTRIM DS,SEPTRA DS) 800-160 MG tablet Take 1 tablet 2 (two) times daily for 7 days by mouth. 01/30/17 02/06/17  Charlesetta Shanks, MD    Family History Family History  Problem Relation Age of Onset  . Diabetes Maternal  Grandmother   . Stroke Maternal Grandmother   . Emphysema Maternal Grandfather   . Heart disease Maternal Grandfather     Social History Social History   Tobacco Use  . Smoking status: Former Research scientist (life sciences)  . Smokeless tobacco: Never Used  Substance Use Topics  . Alcohol use: No  . Drug use: No     Allergies   Patient has no known allergies.   Review of Systems Review of Systems  Constitutional: Positive for activity change.  Gastrointestinal: Negative for nausea and vomiting.  Skin: Positive for rash.  Allergic/Immunologic: Negative for immunocompromised state.     Physical Exam Updated Vital Signs BP 130/77   Pulse (!) 57   Temp 98.7 F (37.1 C) (Oral)   Resp 18   Ht 5\' 4"  (1.626 m)   Wt 81.6 kg (180 lb)   LMP 12/30/2016   SpO2 100%   BMI 30.90 kg/m   Physical Exam  Constitutional: She is oriented to person, place, and time. She appears well-developed.  HENT:  Head: Normocephalic and atraumatic.  Eyes: EOM are normal.  Neck: Normal range of motion. Neck supple.  Cardiovascular: Normal rate.  Pulmonary/Chest: Effort normal.  Abdominal: Bowel sounds are normal.  Musculoskeletal: She exhibits edema and tenderness.  Neurological: She is alert and oriented to person, place, and time.  Skin: Skin is warm and dry.  Nursing note and vitals reviewed.    ED Treatments / Results  Labs (all labs ordered are listed, but only abnormal results are displayed) Labs Reviewed - No data to display  EKG  EKG Interpretation None       Radiology No results found.  Procedures Procedures (including critical care time)  EMERGENCY DEPARTMENT US SOFT TISSUE INTERPRETATION "Study: Limited Soft Tissue Ultrasound"  INDICATIONS: Soft tissue infection Multiple views of the body part were obtained in real-time with a multi-frequency linear probe PERFORMED BY:  Myself IMAGES ARCHIVED?: Yes SIDE:Right  BODY PART:Axilla FINDINGS: Abcess present and Cellulitis  present INTERPRETATION:  Abcess present and Cellulitis present     Medications Ordered in ED Medications  lidocaine-EPINEPHrine (XYLOCAINE W/EPI) 2 %-1:200000 (PF) injection 10 mL (10 mLs Infiltration Given 01/30/17 0643)     Initial Impression / Assessment and Plan / ED Course  I have reviewed the triage vital signs and the nursing notes.  Pertinent labs & imaging results that were available during my care of the patient were reviewed by me and considered in my medical decision making (see chart for details).     Patient has right axillary abscess, confirmed with ultrasound. Abscess will require drainage and patient likely will need antibiotics.  Final Clinical Impressions(s) / ED Diagnoses   Final diagnoses:  Abscess    ED Discharge Orders        Ordered    sulfamethoxazole-trimethoprim (BACTRIM DS,SEPTRA DS) 800-160 MG tablet  2 times daily     01/30/17 0831    acetaminophen (TYLENOL) 500 MG tablet  Every 6 hours PRN     01/30/17 0831       Varney Biles, MD 01/30/17 [phone]

## 2017-01-30 NOTE — ED Provider Notes (Signed)
  Physical Exam  BP 130/77   Pulse (!) 57   Temp 98.7 F (37.1 C) (Oral)   Resp 18   Ht 5\' 4"  (1.626 m)   Wt 81.6 kg (180 lb)   LMP 12/30/2016   SpO2 100%   BMI 30.90 kg/m   Physical Exam Has a 5 cm fluctuant swelling in the right axilla. ED Course  .Marland KitchenIncision and Drainage Date/Time: 01/30/2017 8:23 AM Performed by: Charlesetta Shanks, MD Authorized by: Charlesetta Shanks, MD   Consent:    Consent obtained:  Verbal   Consent given by:  Patient   Risks discussed:  Bleeding, incomplete drainage, pain and infection   Alternatives discussed:  No treatment Location:    Type:  Abscess   Size:  5 cm   Location:  Upper extremity   Upper extremity location:  Arm   Arm location:  R upper arm Pre-procedure details:    Skin preparation:  Betadine Anesthesia (see MAR for exact dosages):    Anesthesia method:  Local infiltration   Local anesthetic:  Lidocaine 2% WITH epi Procedure type:    Complexity:  Complex Procedure details:    Needle aspiration: no     Incision types:  Single straight   Incision depth:  Subcutaneous   Scalpel blade:  11   Wound management:  Probed and deloculated   Drainage:  Purulent   Drainage amount:  Copious   Packing materials:  1/4 in iodoform gauze Post-procedure details:    Patient tolerance of procedure:  Tolerated well, no immediate complications    MDM Patient signed out by Dr.Nanivati for incision and drainage of axillary abscess.  Ablated without difficulty.  Patient had copious purulent discharge.  Packed with iodoform gauze.  Will prescribe Bactrim and return in 48 hours for packing removal.  Return precautions are reviewed.      Charlesetta Shanks, MD 01/30/17 [phone]

## 2017-01-30 NOTE — ED Triage Notes (Signed)
She is currently breast feeding

## 2017-03-28 NOTE — L&D Delivery Note (Addendum)
Delivery Note At 4:43 PM a viable female was delivered via  (Presentation: cephalic; ROA ).  APGAR: , ; weight  .   Placenta status: Spontaneous, intact.  Cord: 3VC   Anesthesia:  IV fentanyl Episiotomy:  none Lacerations:  none Suture Repair: none Est. Blood Loss (mL):  50 mL  Mom to postpartum.  Baby to Couplet care / Skin to Skin.  I entered the room intending to artificially rupture pt's membrane.  During the initial cervical exam pt felt a contraction and pushed which lead to the rupture of her membranes from a gloved finger.  Following the rupture of membranes, pt immediately began pushing with strong contractions and baby's head was visible at the introitus.  I held mild pressure against baby's head while assistance was called into the room.  As the delivery of the baby was imminent and the appropriate supervision was not in the room for the note writer to deliver the baby, the nursing staff completed the uncomplicated delivery.  Baby was delivered ROA with one nuchal cord, was noted to have good tone and placed on maternal abdomen for oral suctioning, drying and stimulation. Delayed cord clamping performed. Placenta delivered intact with 3V cord. Vaginal canal and perineum was inspected and found to be without laceration. Pitocin was started and uterus massaged until bleeding slowed. Counts of sharps, instruments, and lap pads were all correct.  Matilde Haymaker, MD PGY-1 12/07/2017, 5:07 PM   OB FELLOW DELIVERY ATTESTATION  I entered room as RN was delivering infant. I was gloved and present for the entire third stage of labor and management immediately afterwards.   Phill Myron, D.O. OB Fellow  12/07/2017, 6:30 PM

## 2017-07-07 ENCOUNTER — Encounter: Payer: Self-pay | Admitting: Obstetrics and Gynecology

## 2017-07-07 ENCOUNTER — Ambulatory Visit (INDEPENDENT_AMBULATORY_CARE_PROVIDER_SITE_OTHER): Payer: Medicaid Other | Admitting: Obstetrics and Gynecology

## 2017-07-07 ENCOUNTER — Other Ambulatory Visit (HOSPITAL_COMMUNITY)
Admission: RE | Admit: 2017-07-07 | Discharge: 2017-07-07 | Disposition: A | Discharge status: Home / Self Care | Payer: Medicaid Other | Source: Ambulatory Visit | Attending: Obstetrics and Gynecology | Admitting: Obstetrics and Gynecology

## 2017-07-07 VITALS — BP 125/82 | HR 106 | Wt 184.2 lb

## 2017-07-07 DIAGNOSIS — Z87891 Personal history of nicotine dependence: Secondary | ICD-10-CM | POA: Insufficient documentation

## 2017-07-07 DIAGNOSIS — Z3492 Encounter for supervision of normal pregnancy, unspecified, second trimester: Secondary | ICD-10-CM

## 2017-07-07 DIAGNOSIS — Z3A17 17 weeks gestation of pregnancy: Secondary | ICD-10-CM | POA: Diagnosis not present

## 2017-07-07 DIAGNOSIS — O99012 Anemia complicating pregnancy, second trimester: Secondary | ICD-10-CM

## 2017-07-07 DIAGNOSIS — Z3493 Encounter for supervision of normal pregnancy, unspecified, third trimester: Secondary | ICD-10-CM | POA: Insufficient documentation

## 2017-07-07 DIAGNOSIS — D573 Sickle-cell trait: Secondary | ICD-10-CM | POA: Diagnosis not present

## 2017-07-07 DIAGNOSIS — Z3482 Encounter for supervision of other normal pregnancy, second trimester: Secondary | ICD-10-CM

## 2017-07-07 DIAGNOSIS — Z3490 Encounter for supervision of normal pregnancy, unspecified, unspecified trimester: Secondary | ICD-10-CM

## 2017-07-07 NOTE — Progress Notes (Signed)
New OB Note  07/07/2017   Clinic: [facility]  Chief Complaint: NOB  Transfer of Care Patient: no  History of Present Illness: Mckenzie Nelson is a 27 y.o. G2P1001 @ 17/5 weeks ([place] 9/15, based on Patient's last menstrual period was 03/05/2017.=17wk u/s).  Preg complicated by has Sickle cell trait ([place]); History of marijuana use; and Encounter for supervision of normal pregnancy, unspecified, unspecified trimester on their problem list.   Any events prior to today's visit: no Her periods were: qmonth, regular She was using no method when she conceived.  She has Negative signs or symptoms of nausea/vomiting of pregnancy. She has Negative signs or symptoms of miscarriage or preterm labor On any medications around the time she conceived/early pregnancy: No   ROS: A 12-point review of systems was performed and negative, except as stated in the above HPI.  OBGYN History: As per HPI. OB History  Gravida Para Term Preterm AB Living  2 1 1  0 0 1  SAB TAB Ectopic Multiple Live Births  0 0 0 0 1    # Outcome Date GA Lbr Len/2nd Weight Sex Delivery Anes PTL Lv  2 Current           1 Term 08/22/16 [redacted]w[redacted]d 32:28 / 00:13 8 lb 13.1 oz (4 kg) M Vag-Spont EPI  LIV    Any issues with any prior pregnancies: no Prior children are healthy, doing well, and without any problems or issues: yes History of pap smears: Yes. Last pap smear 2017 and results were NILM   Past Medical History: Past Medical History:  Diagnosis Date  . Cellulitis and abscess of buttock 03/12/2013  . False labor 08/21/2016  . Pilonidal cyst     Past Surgical History: Past Surgical History:  Procedure Laterality Date  . NO PAST SURGERIES      Family History:  Family History  Problem Relation Age of Onset  . Diabetes Maternal Grandmother   . Stroke Maternal Grandmother   . Emphysema Maternal Grandfather   . Heart disease Maternal Grandfather    She denies any history of mental retardation,  birth defects or genetic disorders in her or the FOB's history  Social History:  Social History   Socioeconomic History  . Marital status: Single    Spouse name: Not on file  . Number of children: Not on file  . Years of education: Not on file  . Highest education level: Not on file  Occupational History  . Occupation: [employer]  . Financial resource strain: Not on file  . Food insecurity:    Worry: Not on file    Inability: Not on file  . Transportation needs:    Medical: Not on file    Non-medical: Not on file  Tobacco Use  . Smoking status: Former Research scientist (life sciences)  . Smokeless tobacco: Never Used  Substance and Sexual Activity  . Alcohol use: No  . Drug use: No  . Sexual activity: Yes    Partners: Male    Birth control/protection: None  Lifestyle  . Physical activity:    Days per week: Not on file    Minutes per session: Not on file  . Stress: Not on file  Relationships  . Social connections:    Talks on phone: Not on file    Gets together: Not on file    Attends religious service: Not on file    Active member of club or organization: Not on file  Attends meetings of clubs or organizations: Not on file    Relationship status: Not on file  . Intimate partner violence:    Fear of current or ex partner: Not on file    Emotionally abused: Not on file    Physically abused: Not on file    Forced sexual activity: Not on file  Other Topics Concern  . Not on file  Social History Narrative  . Not on file     Allergy: No Known Allergies  Health Maintenance:  Mammogram Up to Date: not applicable  Current Outpatient Medications: PNV  Physical Exam:   BP 125/82   Pulse (!) 106   Wt 184 lb 3.2 oz (83.6 kg)   LMP 03/05/2017   BMI 31.62 kg/m  Body mass index is 31.62 kg/m. Contractions: Not present Vag. Bleeding: None. Fundal height: not applicable FHTs: 712W  General appearance: Well nourished, well developed female in no acute distress.  Neck:   Supple, normal appearance, and no thyromegaly  Cardiovascular: S1, S2 normal, no murmur, rub or gallop, regular rate and rhythm Respiratory:  Clear to auscultation bilateral. Normal respiratory effort Abdomen: positive bowel sounds and no masses, hernias; diffusely non tender to palpation, non distended Breasts: pt declines any breast s/s Neuro/Psych:  Normal mood and affect.  Skin:  Warm and dry.  Lymphatic:  No inguinal lymphadenopathy.   Pelvic exam: is not limited by body habitus EGBUS: within normal limits, Vagina: within normal limits and with no blood in the vault, Cervix: normal appearing cervix without discharge or lesions, closed/long/high, Uterus:  enlarged, c/w 18 week size, and Adnexa:  normal adnexa and no mass, fullness, tenderness  Laboratory: none  Imaging:  Beside u/s: SLIUP, normal FHR  Assessment: pt doing well  Plan: 1. Encounter for supervision of normal pregnancy, antepartum, unspecified gravidity Routine care. Declines genetics. Set up for anatomy u/s in 2wks - Obstetric Panel, Including HIV; Future - Urine Culture; Future - [place] MFM OB COMP + 14 WK; Future - Cervicovaginal ancillary only - Enroll Patient in Babyscripts - Babyscripts Schedule Optimization - Urine Culture - Obstetric Panel, Including HIV  2. [place] trait qtrimester UCx  Problem list reviewed and updated.  Follow up in 4 weeks.  >50% of 20 min visit spent on counseling and coordination of care.     Durene Romans MD Attending [facility])

## 2017-07-08 LAB — OBSTETRIC PANEL, INCLUDING HIV
Antibody Screen: NEGATIVE
BASOS ABS: 0 10*3/uL (ref 0.0–0.2)
Basos: 0 %
EOS (ABSOLUTE): 0 10*3/uL (ref 0.0–0.4)
Eos: 0 %
HIV SCREEN 4TH GENERATION: NONREACTIVE
Hematocrit: 31.6 % — ABNORMAL LOW (ref 34.0–46.6)
Hemoglobin: 10.9 g/dL — ABNORMAL LOW (ref 11.1–15.9)
Hepatitis B Surface Ag: NEGATIVE
IMMATURE GRANS (ABS): 0 10*3/uL (ref 0.0–0.1)
Immature Granulocytes: 0 %
LYMPHS: 21 %
Lymphocytes Absolute: 1.6 10*3/uL (ref 0.7–3.1)
MCH: 28.3 pg (ref 26.6–33.0)
MCHC: 34.5 g/dL (ref 31.5–35.7)
MCV: 82 fL (ref 79–97)
Monocytes Absolute: 0.5 10*3/uL (ref 0.1–0.9)
Monocytes: 7 %
NEUTROS PCT: 72 %
Neutrophils Absolute: 5.3 10*3/uL (ref 1.4–7.0)
PLATELETS: 275 10*3/uL (ref 150–379)
RBC: 3.85 x10E6/uL (ref 3.77–5.28)
RDW: 14.4 % (ref 12.3–15.4)
RPR Ser Ql: NONREACTIVE
Rh Factor: POSITIVE
Rubella Antibodies, IGG: 20.9 index (ref >0.99)
WBC: 7.5 10*3/uL (ref 3.4–10.8)

## 2017-07-08 LAB — URINE CULTURE

## 2017-07-10 LAB — CERVICOVAGINAL ANCILLARY ONLY
CHLAMYDIA, DNA PROBE: NEGATIVE
NEISSERIA GONORRHEA: NEGATIVE
Trichomonas: NEGATIVE

## 2017-07-21 ENCOUNTER — Other Ambulatory Visit: Payer: Self-pay | Admitting: Obstetrics and Gynecology

## 2017-07-21 ENCOUNTER — Ambulatory Visit (HOSPITAL_COMMUNITY)
Admission: RE | Admit: 2017-07-21 | Discharge: 2017-07-21 | Disposition: A | Discharge status: Home / Self Care | Payer: Medicaid Other | Source: Ambulatory Visit | Attending: Obstetrics and Gynecology | Admitting: Obstetrics and Gynecology

## 2017-07-21 DIAGNOSIS — O3412 Maternal care for benign tumor of corpus uteri, second trimester: Secondary | ICD-10-CM

## 2017-07-21 DIAGNOSIS — Z3A19 19 weeks gestation of pregnancy: Secondary | ICD-10-CM

## 2017-07-21 DIAGNOSIS — D259 Leiomyoma of uterus, unspecified: Secondary | ICD-10-CM

## 2017-07-21 DIAGNOSIS — Z363 Encounter for antenatal screening for malformations: Secondary | ICD-10-CM

## 2017-07-21 DIAGNOSIS — Z3490 Encounter for supervision of normal pregnancy, unspecified, unspecified trimester: Secondary | ICD-10-CM

## 2017-07-21 DIAGNOSIS — Z319 Encounter for procreative management, unspecified: Secondary | ICD-10-CM | POA: Diagnosis not present

## 2017-07-28 ENCOUNTER — Ambulatory Visit (INDEPENDENT_AMBULATORY_CARE_PROVIDER_SITE_OTHER): Payer: Medicaid Other | Admitting: Obstetrics and Gynecology

## 2017-07-28 VITALS — BP 101/80 | HR 70 | Wt 185.1 lb

## 2017-07-28 DIAGNOSIS — Z3482 Encounter for supervision of other normal pregnancy, second trimester: Secondary | ICD-10-CM

## 2017-07-28 DIAGNOSIS — D573 Sickle-cell trait: Secondary | ICD-10-CM

## 2017-07-28 DIAGNOSIS — Z3480 Encounter for supervision of other normal pregnancy, unspecified trimester: Secondary | ICD-10-CM

## 2017-07-28 NOTE — Progress Notes (Signed)
Prenatal Visit Note Date: 07/28/2017 Clinic: [facility]  Subjective:  Mckenzie Nelson is a 27 y.o. G2P1001 at [redacted]w[redacted]d being seen today for ongoing prenatal care.  She is currently monitored for the following issues for this low-risk pregnancy and has Sickle cell trait ([place]); History of marijuana use; and Encounter for supervision of normal pregnancy, unspecified, unspecified trimester on their problem list.  Patient reports no complaints.   Contractions: Not present. Vag. Bleeding: None.  Movement: Present. Denies leaking of fluid.   The following portions of the patient's history were reviewed and updated as appropriate: allergies, current medications, past family history, past medical history, past social history, past surgical history and problem list. Problem list updated.  Objective:   Vitals:   07/28/17 0836  BP: 101/80  Pulse: 70  Weight: 185 lb 1.6 oz (84 kg)    Fetal Status: Fetal Heart Rate (bpm): 145   Movement: Present     General:  Alert, oriented and cooperative. Patient is in no acute distress.  Skin: Skin is warm and dry. No rash noted.   Cardiovascular: Normal heart rate noted  Respiratory: Normal respiratory effort, no problems with respiration noted  Abdomen: Soft, gravid, appropriate for gestational age. Pain/Pressure: Absent     Pelvic:  Cervical exam deferred        Extremities: Normal range of motion.  Edema: None  Mental Status: Normal mood and affect. Normal behavior. Normal judgment and thought content.   Urinalysis:      Assessment and Plan:  Pregnancy: G2P1001 at [redacted]w[redacted]d  1. Sickle cell trait (H[facility]) ucx nv  2. Supervision of other normal pregnancy, antepartum Routine care. Pt would like to check BS values instead of doing glucose load. Can check a1c with 28wk labs and I told her i'd recommend having her get a meter and check at home for a few days to a week and have [place] review her #s. Pt on baby scripts  Preterm labor symptoms and  general obstetric precautions including but not limited to vaginal bleeding, contractions, leaking of fluid and fetal movement were reviewed in detail with the patient. Please refer to After Visit Summary for other counseling recommendations.  Return in about 8 weeks (around 09/22/2017).   Aletha Halim, MD

## 2017-08-07 ENCOUNTER — Encounter: Payer: Self-pay | Admitting: Radiology

## 2017-08-23 ENCOUNTER — Telehealth: Payer: Self-pay

## 2017-08-23 NOTE — Telephone Encounter (Signed)
Patient called wanting to know what she can take for some congestion and allergy symptoms she is having is having. I have advised patient to take plain benadryl or take zyrtec or Claritin to help with symptoms.

## 2017-09-22 ENCOUNTER — Ambulatory Visit (INDEPENDENT_AMBULATORY_CARE_PROVIDER_SITE_OTHER): Payer: Medicaid Other | Admitting: Obstetrics & Gynecology

## 2017-09-22 VITALS — BP 104/67 | HR 78 | Wt 184.2 lb

## 2017-09-22 DIAGNOSIS — Z3480 Encounter for supervision of other normal pregnancy, unspecified trimester: Secondary | ICD-10-CM

## 2017-09-22 DIAGNOSIS — O99013 Anemia complicating pregnancy, third trimester: Secondary | ICD-10-CM

## 2017-09-22 MED ORDER — ACCU-CHEK GUIDE W/DEVICE KIT: 1.0000 | PACK | Freq: Four times a day (QID) | 0 refills | Status: DC

## 2017-09-22 MED ORDER — GLUCOSE BLOOD VI STRP: ORAL_STRIP | 12 refills | Status: DC

## 2017-09-22 MED ORDER — ACCU-CHEK FASTCLIX LANCETS MISC: 1.0000 [IU] | Freq: Four times a day (QID) | 12 refills | Status: DC

## 2017-09-22 NOTE — Progress Notes (Signed)
   PRENATAL VISIT NOTE  Subjective:  Mckenzie Nelson is a 27 y.o. G2P1001 at 35w5dbeing seen today for ongoing prenatal care.  She is currently monitored for the following issues for this low-risk pregnancy and has S[place]le cell trait ([place]; History of marijuana use; and Supervision of normal pregnancy in third trimester on their problem list.  Patient reports no complaints.  Contractions: Not present. Vag. Bleeding: None.  Movement: Present. Denies leaking of fluid.   The following portions of the patient's history were reviewed and updated as appropriate: allergies, current medications, past family history, past medical history, past social history, past surgical history and problem list. Problem list updated.  Objective:   Vitals:   09/22/17 0840  BP: 104/67  Pulse: 78  Weight: 184 lb 3.2 oz (83.6 kg)    Fetal Status: Fetal Heart Rate (bpm): 150 Fundal Height: 28 cm Movement: Present     General:  Alert, oriented and cooperative. Patient is in no acute distress.  Skin: Skin is warm and dry. No rash noted.   Cardiovascular: Normal heart rate noted  Respiratory: Normal respiratory effort, no problems with respiration noted  Abdomen: Soft, gravid, appropriate for gestational age.  Pain/Pressure: Absent     Pelvic: Cervical exam deferred        Extremities: Normal range of motion.  Edema: None  Mental Status: Normal mood and affect. Normal behavior. Normal judgment and thought content.   Assessment and Plan:  Pregnancy: G2P1001 at 271w5d1. Supervision of other normal pregnancy, antepartum Patient adamantly refuses to do glucola, will not even consider alternatives. She nevers ingests that much sugar at one time, refuses to do so. Would rather check BS as directed.  Will check CMET (she is fasting today so will have fasting serum glucose) and HgA1C. Blood sugar testing supplies given, advised to check fasting and postprandial for at least 3 days per week. Will reevaluate next visit.  Declines Tdap today. Rest of third trimester labs done today. - HIV antibody - RPR - CBC - Comp Met (CMET) - Hemoglobin A1c - ACCU-CHEK FASTCLIX LANCETS MISC; 1 Units by Percutaneous route 4 (four) times daily.  Dispense: 100 each; Refill: 12 - Blood Glucose Monitoring Suppl (ACCU-CHEK GUIDE) w/Device KIT; 1 Device by Does not apply route 4 (four) times daily.  Dispense: 1 kit; Refill: 0 - glucose blood (ACCU-CHEK GUIDE) test strip; Use to check blood sugars four times a day was instructed  Dispense: 50 each; Refill: 12 Preterm labor symptoms and general obstetric precautions including but not limited to vaginal bleeding, contractions, leaking of fluid and fetal movement were reviewed in detail with the patient. Please refer to After Visit Summary for other counseling recommendations.  Return in about 1 month (around 10/20/2017) for OB 32 week visit (Babyscripts).  Future Appointments  Date Time Provider [place]7/29/2019  2:00 PM Kyrel Leighton, UgSallyanne HaversMD [facility]    UgVerita SchneidersMD

## 2017-09-22 NOTE — Patient Instructions (Addendum)
Fasting 60-90 1 hr after meals <140 2 hr after meals <120   Return to clinic for any scheduled appointments or obstetric concerns, or go to [facility]U for evaluation

## 2017-09-23 LAB — COMPREHENSIVE METABOLIC PANEL
A/G RATIO: 1.1 — AB (ref 1.2–2.2)
ALT: 5 IU/L (ref 0–32)
AST: 9 IU/L (ref 0–40)
Albumin: 3.6 g/dL (ref 3.5–5.5)
Alkaline Phosphatase: 109 IU/L (ref 39–117)
BILIRUBIN TOTAL: 0.3 mg/dL (ref 0.0–1.2)
BUN/Creatinine Ratio: 11 (ref 9–23)
BUN: 5 mg/dL — ABNORMAL LOW (ref 6–20)
CHLORIDE: 103 mmol/L (ref 96–106)
CO2: 20 mmol/L (ref 20–29)
Calcium: 8.6 mg/dL — ABNORMAL LOW (ref 8.7–10.2)
Creatinine, Ser: 0.44 mg/dL — ABNORMAL LOW (ref 0.57–1.00)
GFR calc non Af Amer: 140 mL/min/{1.73_m2} (ref >59)
GFR, EST AFRICAN AMERICAN: 161 mL/min/{1.73_m2} (ref >59)
GLOBULIN, TOTAL: 3.2 g/dL (ref 1.5–4.5)
Glucose: 70 mg/dL (ref 65–99)
POTASSIUM: 3.8 mmol/L (ref 3.5–5.2)
SODIUM: 135 mmol/L (ref 134–144)
TOTAL PROTEIN: 6.8 g/dL (ref 6.0–8.5)

## 2017-09-23 LAB — HEMOGLOBIN A1C
Est. average glucose Bld gHb Est-mCnc: 85 mg/dL
Hgb A1c MFr Bld: 4.6 % — ABNORMAL LOW (ref 4.8–5.6)

## 2017-09-23 LAB — HIV ANTIBODY (ROUTINE TESTING W REFLEX): HIV Screen 4th Generation wRfx: NONREACTIVE

## 2017-09-23 LAB — CBC
Hematocrit: 27.5 % — ABNORMAL LOW (ref 34.0–46.6)
Hemoglobin: 9.5 g/dL — ABNORMAL LOW (ref 11.1–15.9)
MCH: 26.6 pg (ref 26.6–33.0)
MCHC: 34.5 g/dL (ref 31.5–35.7)
MCV: 77 fL — AB (ref 79–97)
PLATELETS: 238 10*3/uL (ref 150–450)
RBC: 3.57 x10E6/uL — ABNORMAL LOW (ref 3.77–5.28)
RDW: 16.2 % — ABNORMAL HIGH (ref 12.3–15.4)
WBC: 9 10*3/uL (ref 3.4–10.8)

## 2017-09-23 LAB — RPR: RPR Ser Ql: NONREACTIVE

## 2017-09-25 DIAGNOSIS — O99013 Anemia complicating pregnancy, third trimester: Secondary | ICD-10-CM | POA: Insufficient documentation

## 2017-09-25 DIAGNOSIS — D509 Iron deficiency anemia, unspecified: Secondary | ICD-10-CM | POA: Insufficient documentation

## 2017-09-25 MED ORDER — FERROUS SULFATE 325 (65 FE) MG PO TABS
325.0000 mg | ORAL_TABLET | Freq: Two times a day (BID) | ORAL | 1 refills | Status: DC
Start: 2017-09-25 — End: 2017-12-08

## 2017-09-25 NOTE — Addendum Note (Signed)
Addended by: Verita Schneiders A on: 09/25/2017 07:48 AM   Modules accepted: Orders

## 2017-10-23 ENCOUNTER — Encounter: Payer: Medicaid Other | Admitting: Obstetrics & Gynecology

## 2017-10-31 ENCOUNTER — Ambulatory Visit (INDEPENDENT_AMBULATORY_CARE_PROVIDER_SITE_OTHER): Payer: Medicaid Other | Admitting: Family Medicine

## 2017-10-31 VITALS — BP 130/83 | HR 109 | Wt 191.4 lb

## 2017-10-31 DIAGNOSIS — Z3483 Encounter for supervision of other normal pregnancy, third trimester: Secondary | ICD-10-CM

## 2017-10-31 NOTE — Patient Instructions (Signed)

## 2017-10-31 NOTE — Progress Notes (Signed)
   PRENATAL VISIT NOTE  Subjective:  Mckenzie Nelson is a 27 y.o. G2P1001 at [redacted]w[redacted]d being seen today for ongoing prenatal care.  She is currently monitored for the following issues for this low-risk pregnancy and has Sickle cell trait ([place]); History of marijuana use; Supervision of normal pregnancy in third trimester; and Anemia affecting pregnancy in third trimester on their problem list.  Patient reports no complaints.  Contractions: Not present. Vag. Bleeding: None.  Movement: Present. Denies leaking of fluid.   The following portions of the patient's history were reviewed and updated as appropriate: allergies, current medications, past family history, past medical history, past social history, past surgical history and problem list. Problem list updated.  Objective:   Vitals:   10/31/17 1135  BP: 130/83  Pulse: (!) 109  Weight: 191 lb 6.4 oz (86.8 kg)    Fetal Status: Fetal Heart Rate (bpm): 140 Fundal Height: 33 cm Movement: Present     General:  Alert, oriented and cooperative. Patient is in no acute distress.  Skin: Skin is warm and dry. No rash noted.   Cardiovascular: Normal heart rate noted  Respiratory: Normal respiratory effort, no problems with respiration noted  Abdomen: Soft, gravid, appropriate for gestational age.  Pain/Pressure: Absent     Pelvic: Cervical exam deferred        Extremities: Normal range of motion.  Edema: None  Mental Status: Normal mood and affect. Normal behavior. Normal judgment and thought content.   Assessment and Plan:  Pregnancy: G2P1001 at [redacted]w[redacted]d  1. Encounter for supervision of other normal pregnancy in third trimester Repeat Hgb--low at 28 wks Declined glucola-nml A1C and nml fasting CBG s/p meal today--check random CBG GBS next visit - Hemoglobin - Glucose  Preterm labor symptoms and general obstetric precautions including but not limited to vaginal bleeding, contractions, leaking of fluid and fetal movement were reviewed in detail  with the patient. Please refer to After Visit Summary for other counseling recommendations.  Return in 3 weeks (on 11/21/2017).  Future Appointments  Date Time Provider Department Center  11/28/2017  9:45 AM Anyanwu, Sallyanne Havers, MD [facility]    Donnamae Jude, MD

## 2017-11-01 ENCOUNTER — Telehealth: Payer: Self-pay

## 2017-11-01 LAB — GLUCOSE, RANDOM: Glucose: 80 mg/dL (ref 65–99)

## 2017-11-01 LAB — HEMOGLOBIN: HEMOGLOBIN: 8.4 g/dL — AB (ref 11.1–15.9)

## 2017-11-01 NOTE — Telephone Encounter (Signed)
Returned patient call - patient wanted to know if she could try OTC/Prescribed iron supplement and increase her vegetable intake before doing iron infusions. Per provider review - no, her iron is too low to try that route unfortunately. Patient stated she understood and had no other questions.

## 2017-11-01 NOTE — Telephone Encounter (Signed)
-----   Message from Blanchie Dessert, [place] sent at 11/01/2017 11:26 AM EDT ----- Regarding: patient requesting a call back Please call patient would like to speak to clinical

## 2017-11-07 NOTE — Addendum Note (Signed)
Addended by: Donnamae Jude on: 11/07/2017 04:59 PM   Modules accepted: Miquel Dunn

## 2017-11-13 ENCOUNTER — Telehealth: Payer: Self-pay

## 2017-11-13 NOTE — Telephone Encounter (Signed)
-----   Message from Donnamae Jude, MD sent at 11/13/2017 10:20 AM EDT ----- Ok--orders are in--please call and schedule with Zac[facility] Pontes Day ----- Message ----- From: Cristopher Peru, CMA Sent: 11/01/2017  10:22 AM EDT To: Donnamae Jude, MD  Good Morning Dr.Pratt,  Patient has been advised of lab results and your recommendations. She is in agreement with having the iron infusion done. Please put orders in. Thanks! ----- Message ----- From: Donnamae Jude, MD Sent: 11/01/2017  10:13 AM To: [place]  Needs iron infusion, please get set up and let me know to put in orders for her.

## 2017-11-13 NOTE — Addendum Note (Signed)
Addended by: Donnamae Jude on: 11/13/2017 10:20 AM   Modules accepted: Orders

## 2017-11-15 ENCOUNTER — Telehealth (HOSPITAL_COMMUNITY): Payer: Self-pay | Admitting: Family Medicine

## 2017-11-15 ENCOUNTER — Encounter (HOSPITAL_COMMUNITY): Payer: Medicaid Other

## 2017-11-15 NOTE — Telephone Encounter (Signed)
Pt called and states that she will need to reschedule the appt.today for feraheme infusion; states that she has notified her referring physician's office

## 2017-11-28 ENCOUNTER — Other Ambulatory Visit (HOSPITAL_COMMUNITY)
Admission: RE | Admit: 2017-11-28 | Discharge: 2017-11-28 | Disposition: A | Discharge status: Home / Self Care | Payer: Medicaid Other | Source: Ambulatory Visit | Attending: Obstetrics & Gynecology | Admitting: Obstetrics & Gynecology

## 2017-11-28 ENCOUNTER — Ambulatory Visit (INDEPENDENT_AMBULATORY_CARE_PROVIDER_SITE_OTHER): Payer: Medicaid Other | Admitting: Obstetrics & Gynecology

## 2017-11-28 VITALS — BP 133/82 | HR 82 | Wt 192.4 lb

## 2017-11-28 DIAGNOSIS — Z3483 Encounter for supervision of other normal pregnancy, third trimester: Secondary | ICD-10-CM

## 2017-11-28 DIAGNOSIS — O99013 Anemia complicating pregnancy, third trimester: Secondary | ICD-10-CM

## 2017-11-28 LAB — CBC
HEMATOCRIT: 28.3 % — AB (ref 34.0–46.6)
Hemoglobin: 8.9 g/dL — ABNORMAL LOW (ref 11.1–15.9)
MCH: 24 pg — ABNORMAL LOW (ref 26.6–33.0)
MCHC: 31.4 g/dL — AB (ref 31.5–35.7)
MCV: 76 fL — AB (ref 79–97)
Platelets: 233 10*3/uL (ref 150–450)
RBC: 3.71 x10E6/uL — ABNORMAL LOW (ref 3.77–5.28)
RDW: 18 % — ABNORMAL HIGH (ref 12.3–15.4)
WBC: 9.6 10*3/uL (ref 3.4–10.8)

## 2017-11-28 NOTE — Patient Instructions (Signed)
Return to clinic for any scheduled appointments or obstetric concerns, or go to [facility]U for evaluation  

## 2017-11-28 NOTE — Progress Notes (Signed)
   PRENATAL VISIT NOTE  Subjective:  Mckenzie Nelson is a 27 y.o. G2P1001 at [redacted]w[redacted]d being seen today for ongoing prenatal care.  She is currently monitored for the following issues for this low-risk pregnancy and has Sickle cell trait ([place]); History of marijuana use; Supervision of normal pregnancy in third trimester; and Anemia affecting pregnancy in third trimester on their problem list.  Patient reports occasional contractions.  Contractions: Not present. Vag. Bleeding: None.  Movement: Present. Denies leaking of fluid.   The following portions of the patient's history were reviewed and updated as appropriate: allergies, current medications, past family history, past medical history, past social history, past surgical history and problem list. Problem list updated.  Objective:   Vitals:   11/28/17 1008  BP: 133/82  Pulse: 82  Weight: 192 lb 6.4 oz (87.3 kg)    Fetal Status: Fetal Heart Rate (bpm): 145 Fundal Height: 38 cm Movement: Present  Presentation: Vertex  General:  Alert, oriented and cooperative. Patient is in no acute distress.  Skin: Skin is warm and dry. No rash noted.   Cardiovascular: Normal heart rate noted  Respiratory: Normal respiratory effort, no problems with respiration noted  Abdomen: Soft, gravid, appropriate for gestational age.  Pain/Pressure: Absent     Pelvic: Cervical exam performed Dilation: 3 Effacement (%): 70 Station: -2  Extremities: Normal range of motion.  Edema: None  Mental Status: Normal mood and affect. Normal behavior. Normal judgment and thought content.   Assessment and Plan:  Pregnancy: G2P1001 at [redacted]w[redacted]d  1. Anemia affecting pregnancy in third trimester Has not received Feraheme as prescribed, rescheduled for this Friday 12/01/16. Will check CBC and Ferritin today, ascertain possible need for repeat dosages of Feraheme.  - CBC - Ferritin  2. Encounter for supervision of other normal pregnancy in third trimester Pelvic cultures done.  -  Culture, beta strep (group b only) - Cervicovaginal ancillary only Term labor symptoms and general obstetric precautions including but not limited to vaginal bleeding, contractions, leaking of fluid and fetal movement were reviewed in detail with the patient. Please refer to After Visit Summary for other counseling recommendations.  Return in about 1 week (around 12/05/2017) for OB 39 week visit (Babyscripts).   Future Appointments  Date Time Provider [place]  12/01/2017 11:00 AM [facility] RM 2 [facility] None    Verita Schneiders, MD

## 2017-11-29 ENCOUNTER — Telehealth: Payer: Self-pay

## 2017-11-29 LAB — CERVICOVAGINAL ANCILLARY ONLY
CHLAMYDIA, DNA PROBE: NEGATIVE
NEISSERIA GONORRHEA: NEGATIVE

## 2017-11-29 LAB — FERRITIN: Ferritin: 8 ng/mL — ABNORMAL LOW (ref 15–150)

## 2017-11-29 NOTE — Telephone Encounter (Signed)
Patient thinks she has a yeast infection and would like to try otc monistat 7. Advised her this would be ok.

## 2017-11-29 NOTE — Telephone Encounter (Signed)
-----   Message from Blanchie Dessert, [place] sent at 11/29/2017  1:39 PM EDT ----- Regarding: patient requesting a call  Please call patient, she has a question for clinical team

## 2017-12-01 ENCOUNTER — Ambulatory Visit (HOSPITAL_COMMUNITY)
Admission: RE | Admit: 2017-12-01 | Discharge: 2017-12-01 | Disposition: A | Discharge status: Home / Self Care | Payer: Medicaid Other | Source: Ambulatory Visit | Attending: Family Medicine | Admitting: Family Medicine

## 2017-12-01 DIAGNOSIS — O99019 Anemia complicating pregnancy, unspecified trimester: Secondary | ICD-10-CM | POA: Diagnosis not present

## 2017-12-01 DIAGNOSIS — Z3A00 Weeks of gestation of pregnancy not specified: Secondary | ICD-10-CM | POA: Diagnosis not present

## 2017-12-01 LAB — CULTURE, BETA STREP (GROUP B ONLY): STREP GP B CULTURE: NEGATIVE

## 2017-12-01 MED ORDER — SODIUM CHLORIDE 0.9 % IV SOLN
INTRAVENOUS | Status: DC | PRN
  Administered 2017-12-01: 250 mL via INTRAVENOUS

## 2017-12-01 MED ORDER — SODIUM CHLORIDE 0.9 % IV SOLN
510.0000 mg | INTRAVENOUS | Status: DC
  Administered 2017-12-01: 510 mg via INTRAVENOUS
  Filled 2017-12-01: qty 17

## 2017-12-01 NOTE — Progress Notes (Signed)
PATIENT CARE CENTER NOTE  Diagnosis: Iron Deficiency Anemia    Provider: Dr. Kennon Rounds   Procedure: IV Feraheme    Note: Received call back from Dr.Foutz at [facility]. Informed Dr. Rosana Hoes that patient became light-headed at start of iron infusion. Also informed her that patient had reported that she had not eaten this morning. Dr. Rosana Hoes recommended that patient eat something and iron infusion be restarted. If patient experienced symptoms after restarting infusion then Dr. Rosana Hoes advised that the infusion should be stopped and discontinued.  Restarted Feraheme infusion and patient tolerated infusion well without any adverse reactions. Monitored patient for 30 minutes post-infusion. Vitals signs stable. Discharge instructions given to patient. Patient alert, oriented and ambulatory at discharge.

## 2017-12-01 NOTE — Progress Notes (Signed)
Patient with complaint of dizziness during Feraheme infusion.  Infusion stopped and NS started.  Physician office telephoned and message left with answering service.  Stated to call back if we did not hear back in 20 minutes.  Alda at [facility] [phone].

## 2017-12-01 NOTE — Discharge Instructions (Signed)

## 2017-12-05 ENCOUNTER — Encounter (HOSPITAL_COMMUNITY): Payer: Self-pay | Admitting: *Deleted

## 2017-12-05 ENCOUNTER — Ambulatory Visit (INDEPENDENT_AMBULATORY_CARE_PROVIDER_SITE_OTHER): Payer: Medicaid Other | Admitting: Obstetrics and Gynecology

## 2017-12-05 ENCOUNTER — Telehealth (HOSPITAL_COMMUNITY): Payer: Self-pay | Admitting: *Deleted

## 2017-12-05 VITALS — BP 121/75 | HR 76 | Wt 193.2 lb

## 2017-12-05 DIAGNOSIS — Z3483 Encounter for supervision of other normal pregnancy, third trimester: Secondary | ICD-10-CM

## 2017-12-05 DIAGNOSIS — O99013 Anemia complicating pregnancy, third trimester: Secondary | ICD-10-CM | POA: Diagnosis not present

## 2017-12-05 NOTE — Telephone Encounter (Signed)
Preadmission screen  

## 2017-12-05 NOTE — Progress Notes (Signed)
Prenatal Visit Note Date: 12/05/2017 [facility]  Subjective:  Mckenzie Nelson is a 27 y.o. G2P1001 at [redacted]w[redacted]d being seen today for ongoing prenatal care.  She is currently monitored for the following issues for this low-risk pregnancy and has Sickle cell trait ([place]); History of marijuana use; Supervision of normal pregnancy in third trimester; and Anemia affecting pregnancy in third trimester on their problem list.  Patient reports no complaints.   Contractions: Irregular. Vag. Bleeding: None.  Movement: Present. Denies leaking of fluid.   The following portions of the patient's history were reviewed and updated as appropriate: allergies, current medications, past family history, past medical history, past social history, past surgical history and problem list. Problem list updated.  Objective:   Vitals:   12/05/17 0946  BP: 121/75  Pulse: 76  Weight: 193 lb 3.2 oz (87.6 kg)    Fetal Status: Fetal Heart Rate (bpm): 140 Fundal Height: 39 cm Movement: Present  Presentation: Vertex  General:  Alert, oriented and cooperative. Patient is in no acute distress.  Skin: Skin is warm and dry. No rash noted.   Cardiovascular: Normal heart rate noted  Respiratory: Normal respiratory effort, no problems with respiration noted  Abdomen: Soft, gravid, appropriate for gestational age. Pain/Pressure: Absent     Pelvic:  Cervical exam deferred        Extremities: Normal range of motion.  Edema: Trace  Mental Status: Normal mood and affect. Normal behavior. Normal judgment and thought content.   Urinalysis:      Assessment and Plan:  Pregnancy: G2P1001 at [redacted]w[redacted]d  1. Encounter for supervision of other normal pregnancy in third trimester Will set up for 41wk iol  Preterm labor symptoms and general obstetric precautions including but not limited to vaginal bleeding, contractions, leaking of fluid and fetal movement were reviewed in detail with the patient. Please refer to  After Visit Summary for other counseling recommendations.  Return in about 1 week (around 12/12/2017) for rob.   Aletha Halim, MD

## 2017-12-07 ENCOUNTER — Inpatient Hospital Stay (HOSPITAL_COMMUNITY)
Admission: AD | Admit: 2017-12-07 | Discharge: 2017-12-09 | DRG: 807 | Disposition: A | Discharge status: Home / Self Care | Payer: Medicaid Other | Attending: Obstetrics and Gynecology | Admitting: Obstetrics and Gynecology

## 2017-12-07 ENCOUNTER — Encounter (HOSPITAL_COMMUNITY): Payer: Self-pay

## 2017-12-07 ENCOUNTER — Other Ambulatory Visit: Payer: Self-pay

## 2017-12-07 DIAGNOSIS — O9902 Anemia complicating childbirth: Secondary | ICD-10-CM | POA: Diagnosis present

## 2017-12-07 DIAGNOSIS — D509 Iron deficiency anemia, unspecified: Secondary | ICD-10-CM | POA: Diagnosis present

## 2017-12-07 DIAGNOSIS — Z87891 Personal history of nicotine dependence: Secondary | ICD-10-CM

## 2017-12-07 DIAGNOSIS — D573 Sickle-cell trait: Secondary | ICD-10-CM | POA: Diagnosis present

## 2017-12-07 DIAGNOSIS — O99013 Anemia complicating pregnancy, third trimester: Secondary | ICD-10-CM | POA: Diagnosis present

## 2017-12-07 DIAGNOSIS — Z3493 Encounter for supervision of normal pregnancy, unspecified, third trimester: Secondary | ICD-10-CM

## 2017-12-07 DIAGNOSIS — O99019 Anemia complicating pregnancy, unspecified trimester: Secondary | ICD-10-CM | POA: Diagnosis present

## 2017-12-07 DIAGNOSIS — Z3A39 39 weeks gestation of pregnancy: Secondary | ICD-10-CM

## 2017-12-07 DIAGNOSIS — Z3483 Encounter for supervision of other normal pregnancy, third trimester: Secondary | ICD-10-CM | POA: Diagnosis present

## 2017-12-07 DIAGNOSIS — Z87898 Personal history of other specified conditions: Secondary | ICD-10-CM

## 2017-12-07 DIAGNOSIS — F1291 Cannabis use, unspecified, in remission: Secondary | ICD-10-CM

## 2017-12-07 LAB — CBC
HEMATOCRIT: 29 % — AB (ref 36.0–46.0)
HEMOGLOBIN: 9.5 g/dL — AB (ref 12.0–15.0)
MCH: 24.8 pg — ABNORMAL LOW (ref 26.0–34.0)
MCHC: 32.8 g/dL (ref 30.0–36.0)
MCV: 75.7 fL — ABNORMAL LOW (ref 78.0–100.0)
Platelets: 225 10*3/uL (ref 150–400)
RBC: 3.83 MIL/uL — ABNORMAL LOW (ref 3.87–5.11)
RDW: 19.7 % — ABNORMAL HIGH (ref 11.5–15.5)
WBC: 13.3 10*3/uL — ABNORMAL HIGH (ref 4.0–10.5)

## 2017-12-07 LAB — TYPE AND SCREEN
ABO/RH(D): B POS
Antibody Screen: NEGATIVE

## 2017-12-07 MED ORDER — ONDANSETRON HCL 4 MG PO TABS: 4.0000 mg | ORAL_TABLET | ORAL | Status: DC | PRN

## 2017-12-07 MED ORDER — ACETAMINOPHEN 325 MG PO TABS
650.0000 mg | ORAL_TABLET | ORAL | Status: DC | PRN
  Administered 2017-12-08: 650 mg via ORAL
  Filled 2017-12-07: qty 2

## 2017-12-07 MED ORDER — IBUPROFEN 600 MG PO TABS
600.0000 mg | ORAL_TABLET | Freq: Four times a day (QID) | ORAL | Status: DC
  Administered 2017-12-07 – 2017-12-09 (×7): 600 mg via ORAL
  Filled 2017-12-07 (×8): qty 1

## 2017-12-07 MED ORDER — OXYTOCIN 40 UNITS IN LACTATED RINGERS INFUSION - SIMPLE MED
2.5000 [IU]/h | INTRAVENOUS | Status: DC
  Administered 2017-12-07: 2.5 [IU]/h via INTRAVENOUS
  Filled 2017-12-07: qty 1000

## 2017-12-07 MED ORDER — ONDANSETRON HCL 4 MG/2ML IJ SOLN: 4.0000 mg | Freq: Four times a day (QID) | INTRAMUSCULAR | Status: DC | PRN

## 2017-12-07 MED ORDER — LACTATED RINGERS IV SOLN
INTRAVENOUS | Status: DC
  Administered 2017-12-07 (×2): via INTRAVENOUS

## 2017-12-07 MED ORDER — OXYTOCIN BOLUS FROM INFUSION
500.0000 mL | Freq: Once | INTRAVENOUS | Status: AC
  Administered 2017-12-07: 500 mL via INTRAVENOUS

## 2017-12-07 MED ORDER — ACETAMINOPHEN 325 MG PO TABS: 650.0000 mg | ORAL_TABLET | ORAL | Status: DC | PRN

## 2017-12-07 MED ORDER — SIMETHICONE 80 MG PO CHEW: 80.0000 mg | CHEWABLE_TABLET | ORAL | Status: DC | PRN

## 2017-12-07 MED ORDER — SENNOSIDES-DOCUSATE SODIUM 8.6-50 MG PO TABS
2.0000 | ORAL_TABLET | ORAL | Status: DC
  Filled 2017-12-07 (×2): qty 2

## 2017-12-07 MED ORDER — DIPHENHYDRAMINE HCL 25 MG PO CAPS: 25.0000 mg | ORAL_CAPSULE | Freq: Four times a day (QID) | ORAL | Status: DC | PRN

## 2017-12-07 MED ORDER — OXYCODONE-ACETAMINOPHEN 5-325 MG PO TABS: 2.0000 | ORAL_TABLET | ORAL | Status: DC | PRN

## 2017-12-07 MED ORDER — PRENATAL MULTIVITAMIN CH
1.0000 | ORAL_TABLET | Freq: Every day | ORAL | Status: DC
  Filled 2017-12-07: qty 1

## 2017-12-07 MED ORDER — OXYCODONE-ACETAMINOPHEN 5-325 MG PO TABS: 1.0000 | ORAL_TABLET | ORAL | Status: DC | PRN

## 2017-12-07 MED ORDER — LIDOCAINE HCL (PF) 1 % IJ SOLN
30.0000 mL | INTRAMUSCULAR | Status: DC | PRN
  Filled 2017-12-07: qty 30

## 2017-12-07 MED ORDER — LACTATED RINGERS IV SOLN: 500.0000 mL | INTRAVENOUS | Status: DC | PRN

## 2017-12-07 MED ORDER — WITCH HAZEL-GLYCERIN EX PADS: 1.0000 "application " | MEDICATED_PAD | CUTANEOUS | Status: DC | PRN

## 2017-12-07 MED ORDER — FENTANYL CITRATE (PF) 100 MCG/2ML IJ SOLN
100.0000 ug | INTRAMUSCULAR | Status: DC | PRN
  Administered 2017-12-07 (×3): 100 ug via INTRAVENOUS
  Filled 2017-12-07 (×3): qty 2

## 2017-12-07 MED ORDER — DIBUCAINE 1 % RE OINT: 1.0000 "application " | TOPICAL_OINTMENT | RECTAL | Status: DC | PRN

## 2017-12-07 MED ORDER — SOD CITRATE-CITRIC ACID 500-334 MG/5ML PO SOLN: 30.0000 mL | ORAL | Status: DC | PRN

## 2017-12-07 MED ORDER — ZOLPIDEM TARTRATE 5 MG PO TABS: 5.0000 mg | ORAL_TABLET | Freq: Every evening | ORAL | Status: DC | PRN

## 2017-12-07 MED ORDER — COCONUT OIL OIL: 1.0000 "application " | TOPICAL_OIL | Status: DC | PRN

## 2017-12-07 MED ORDER — ONDANSETRON HCL 4 MG/2ML IJ SOLN: 4.0000 mg | INTRAMUSCULAR | Status: DC | PRN

## 2017-12-07 MED ORDER — BENZOCAINE-MENTHOL 20-0.5 % EX AERO: 1.0000 "application " | INHALATION_SPRAY | CUTANEOUS | Status: DC | PRN

## 2017-12-07 MED ORDER — TETANUS-DIPHTH-ACELL PERTUSSIS 5-2.5-18.5 LF-MCG/0.5 IM SUSP: 0.5000 mL | Freq: Once | INTRAMUSCULAR | Status: DC

## 2017-12-07 NOTE — Progress Notes (Signed)
Labor Progress Note Mckenzie Nelson is a 27 y.o. G2P1001 with IUP at [redacted]w[redacted]d presented for SOL.  S: Discussed AROM to help progress labor. Patient elected to hold off due to concerns about intensifying contractions and pain. Sleepy in room. FOB, son in room.  O:  BP 123/68   Pulse 74   Temp 97.9 F (36.6 C) (Oral)   Resp 18   Ht 5\' 4"  (1.626 m)   Wt 87.6 kg   LMP 03/05/2017   SpO2 100%   BMI 33.16 kg/m  EFM: baseline 120/positive accel/1 deccel  CVE: Dilation: 8 Effacement (%): 100 Cervical Position: Middle Station: 0 Presentation: Vertex Exam by:: Wess Botts RN   A&P: 27 y.o. G2P1001 [redacted]w[redacted]d for SOL. #Labor: AROM deferred until patient feels ready, but she is progressing well. #Pain: IV fentanyl PRN q1h PRN. Not planing to use epidural at this time. #FWB: category II #GBS negative  Planing to breastfeed R.I./Ref Tdap  Irene Shipper, Medical Student 2:45 PM

## 2017-12-07 NOTE — Anesthesia Pain Management Evaluation Note (Signed)
  [facility] Pain Management Visit Note  Patient: Mckenzie Nelson, 27 y.o., female  "Hello I am a member of the anesthesia team at [facility]. We have an anesthesia team available at all times to provide care throughout the hospital, including epidural management and anesthesia for C-section. I don't know your plan for the delivery whether it a natural birth, water birth, IV sedation, nitrous supplementation, doula or epidural, but we want to meet your pain goals."   1.Was your pain managed to your expectations on prior hospitalizations?   Yes   2.What is your expectation for pain management during this hospitalization?     Labor support without medications and IV pain meds  3.How can we help you reach that goal?*support  Record the patient's initial score and the patient's pain goal.   Pain: 8  Pain Goal: 6 [facility] wants you to be able to say your pain was always managed very well.  Sandrea Matte 12/07/2017

## 2017-12-07 NOTE — H&P (Addendum)
OBSTETRIC ADMISSION HISTORY AND PHYSICAL  Mckenzie Nelson is a 27 y.o. female G2P1001 with IUP at [redacted]w[redacted]d by LMP presenting for SOL with contractions beginning at 5:30am today.   Reports fetal movement. Denies vaginal bleeding, fluid leakage. Intact. Denies HA, blurred vision, RUQ pain, edema.  She received her prenatal care at [facility] starting at 17 wks and 5 days. Support person in labor: FOB  Ultrasounds Anatomy U/S: at [place] (07/21/17). Normal anatomy. Cephalic. Placenta posterior above os. Est 355 gm, 58%ile. Maternal [place]: 2 anterior uterine myomas  Prenatal History/Complications: . Sickle cell trait . History of marijuana use . Uterine fibroids . Refused OGTT, checking BS. Hgb A1C remained WNL throughout third trimester . Anemia, 1x infusion of ferroheme 12/01/17 for low Hgb  Past Medical History: Past Medical History:  Diagnosis Date  . Anemia   . Cellulitis and abscess of buttock 03/12/2013  . False labor 08/21/2016  . Pilonidal cyst     Past Surgical History: Past Surgical History:  Procedure Laterality Date  . NO PAST SURGERIES      Obstetrical History: OB History    Gravida  2   Para  1   Term  1   Preterm  0   AB  0   Living  1     SAB  0   TAB  0   Ectopic  0   Multiple  0   Live Births  1           Social History: Social History   Socioeconomic History  . Marital status: Single    Spouse name: Not on file  . Number of children: Not on file  . Years of education: Not on file  . Highest education level: Not on file  Occupational History  . Occupation: [employer]  . Financial resource strain: Not hard at all  . Food insecurity:    Worry: Never true    Inability: Never true  . Transportation needs:    Medical: No    Non-medical: Not on file  Tobacco Use  . Smoking status: Former Research scientist (life sciences)  . Smokeless tobacco: Never Used  Substance and Sexual Activity  . Alcohol use: No  . Drug use: No  . Sexual activity: Yes     Partners: Male    Birth control/protection: None  Lifestyle  . Physical activity:    Days per week: 0 days    Minutes per session: 0 min  . Stress: Not at all  Relationships  . Social connections:    Talks on phone: Not on file    Gets together: Not on file    Attends religious service: Not on file    Active member of club or organization: Not on file    Attends meetings of clubs or organizations: Not on file    Relationship status: Not on file  Other Topics Concern  . Not on file  Social History Narrative  . Not on file    Family History: Family History  Problem Relation Age of Onset  . Diabetes Maternal Grandmother   . Stroke Maternal Grandmother   . Emphysema Maternal Grandfather   . Heart disease Maternal Grandfather     Allergies: No Known Allergies  Medications Prior to Admission  Medication Sig Dispense Refill Last Dose  . acetaminophen (TYLENOL) 500 MG tablet Take 2 tablets (1,000 mg total) every 6 (six) hours as needed by mouth. 30 tablet 0 Taking  . ferrous sulfate (FERROUSUL) 325 (65  FE) MG tablet Take 1 tablet (325 mg total) by mouth 2 (two) times daily. 60 tablet 1 Taking     Review of Systems  All systems reviewed and negative except as stated in HPI  Blood pressure 134/79, temperature (!) 97.4 F (36.3 C), temperature source Oral, resp. rate 18, last menstrual period 03/05/2017, SpO2 100 %, currently breastfeeding. General appearance: cooperative and no distress Lungs: no respiratory distress Heart: regular rate  Abdomen: soft, non-tender; gravid b Pelvic: see below Extremities: Homans sign is negative, no sign of DVT Presentation: cephalic Fetal monitoring: 120 bpm, mod variability, +acels, no decels  Uterine activity: contractions q 2-32min Dilation: 4 Effacement (%): 70 Station: -2 Exam by:: Truitt Leep, RNC  Prenatal labs: ABO, Rh: B/Positive/-- (04/12 0940) Antibody: Negative (04/12 0940) Rubella: 20.90 (04/12 0940) RPR: Non Reactive  (06/28 0927)  HBsAg: Negative (04/12 0940)  HIV: Non Reactive (06/28 0927)  GBS:   Negative Glucola: Refused. Regular fBG checks have been wnl. A1c 4.6 at 09/22/17 Genetic screening:  Declined  Prenatal Transfer Tool  Maternal Diabetes: No Genetic Screening: Declined Maternal Ultrasounds/Referrals: Abnormal:  Findings:   Other: uterine fibroids Fetal Ultrasounds or other Referrals:  Fetal echo Maternal Substance Abuse:  Yes:  Type: Marijuana Significant Maternal Medications:  None Significant Maternal Lab Results: Lab values include: Other:  ferritin 8 (11/28/17), Hgb/Hct 9.5/29.0 (12/07/17)  Patient Active Problem List   Diagnosis Date Noted  . Indication for care in labor or delivery 12/07/2017  . Anemia affecting pregnancy in third trimester 09/25/2017  . Supervision of normal pregnancy in third trimester 07/07/2017  . History of marijuana use 02/13/2016  . Sickle cell trait ([place]) 02/11/2016    Assessment/Plan:  Mckenzie Nelson is a 27 y.o. G2P1001 at [redacted]w[redacted]d here for SOL.    Labor: Progressing well, no augmentation.  -- pain control: requests pain meds at this time. Declines epidural for now.  Fetal Wellbeing:  Cephalic by sutures.  -- GBS-negative -- continuous fetal monitoring - Category 1  Postpartum Planning -- planning to breastfeed -- R.I./[refused]Tdap   Phill Myron, D.O. [facility] Family Medicine Fellow, [facility] for [facility], [place] [facility] 12/07/2017, 1:45 PM

## 2017-12-07 NOTE — MAU Note (Signed)
Pt states CTX every 2 minutes since 0530 this morning. States no lof or vb. Reports pos fetal movement.

## 2017-12-08 LAB — RPR: RPR: NONREACTIVE

## 2017-12-08 NOTE — Progress Notes (Signed)
CSW received consult for hx of marijuana use.  Referral was screened out due to the following: °~MOB had no documented substance use after initial prenatal visit/+UPT. °~MOB had no positive drug screens after initial prenatal visit/+UPT. °~Baby's UDS is negative. ° °Please consult CSW if current concerns arise or by MOB's request. ° °CSW will monitor CDS results and make report to Child Protective Services if warranted. ° °Inari Shin Boyd-Gilyard, MSW, LCSW °Clinical Social Work °[phone] ° °

## 2017-12-08 NOTE — Lactation Note (Addendum)
This note was copied from a baby's chart. Lactation Consultation Note Baby 24 hrs old. Mom states feeding well but doesn't open her mouth very wide. Mom has large everted nipples. Mom BF her 27 yr old for 7 months.  Hand expression demonstrated easy flow of colostrum. Praised mom. Discussed chin tug and stimulating top lip w/nipple to get baby to open wider.  Discussed positioning, support, comfort, breast massage, STS, I&O, supply and demand. Mom encouraged to feed baby 8-12 times/24 hours and with feeding cues.  Baby has been spitting up some, fast delivery.  Encouraged mom to call for assistance or has questions. Encouraged mom to call out for next feeding. RN needs to see a latch. [place] brochure given w/resources, support groups and [place] services.  Patient Name: Mckenzie Nelson ATFTD'D Date: 12/08/2017 Reason for consult: Initial assessment   Maternal Data Has patient been taught Hand Expression?: Yes Does the patient have breastfeeding experience prior to this delivery?: Yes  Feeding Feeding Type: Breast Fed  LATCH Score       Type of Nipple: Everted at rest and after stimulation  Comfort (Breast/Nipple): Soft / non-tender        Interventions Interventions: Breast feeding basics reviewed  Lactation Tools Discussed/Used WIC Program: No   Consult Status Consult Status: Follow-up Date: 12/09/17 Follow-up type: In-patient    Theodoro Kalata 12/08/2017, 7:45 PM

## 2017-12-08 NOTE — Progress Notes (Addendum)
POSTPARTUM PROGRESS NOTE Post Partum Day 1  Subjective: Mckenzie Nelson is a 27 y.o. [id number] [redacted]w[redacted]d s/p SVD.  No acute events overnight.  Pt denies problems with ambulating, voiding or po intake.  She denies nausea or vomiting.  Pain is moderately controlled on scheduled ibuprofen. She had one tylenol PRN at midnight.  She has had flatus. She has not had bowel movement.  Lochia appropriate.  Objective: Blood pressure 119/66, pulse 81, temperature 98.2 F (36.8 C), temperature source Oral, resp. rate 18, height 5\' 4"  (1.626 m), weight 87.6 kg, last menstrual period 03/05/2017, SpO2 99 %, unknown if currently breastfeeding.  Physical Exam:  General: alert, cooperative and no distress Lochia: appropriate  Chest: no respiratory distress Heart: Normal rate Uterine Fundus: firm DVT Evaluation: No calf swelling or tenderness, no LE edema b/l  Recent Labs    12/07/17 0946  HGB 9.5*  HCT 29.0*    Assessment/Plan:  ASSESSMENT: Mckenzie Nelson is a 27 y.o. [id number] [redacted]w[redacted]d ppd#1 with sickle cell trait and hx of anemia s/p SVD  # Breastfeeding  # Contraception OCPs # Hgb stable at 9.5, asymptomatic  # Plan to discharge on PPD day 2    LOS: 1 day   Vassie Moselle, Medical Student 12/08/2017, 10:37 AM   Attestation: I have seen this patient and agree with the medical student's documentation. I have examined them separately, and we have discussed the plan of care.  Lambert Mody. Juleen China, DO OB/GYN Fellow

## 2017-12-09 MED ORDER — IBUPROFEN 600 MG PO TABS: 600.0000 mg | ORAL_TABLET | Freq: Four times a day (QID) | ORAL | 0 refills | Status: DC | PRN

## 2017-12-09 NOTE — Lactation Note (Signed)
This note was copied from a baby's chart. Lactation Consultation Note  Patient Name: Mckenzie Nelson [id number] Date: 12/09/2017   P2, Baby latched upon entering.  Intermittent sucks and swallows. Mom encouraged to feed baby 8-12 times/24 hours and with feeding cues.  Reviewed engorgement care and monitoring voids/stools. Mother denies questions or concerns.      Maternal Data    Feeding Feeding Type: Breast Fed Length of feed: 10 min  LATCH Score                   Interventions    Lactation Tools Discussed/Used     Consult Status      Carlye Grippe 12/09/2017, 9:48 AM

## 2017-12-09 NOTE — Discharge Summary (Signed)
OB Discharge Summary     Patient Name: Mckenzie Nelson DOB: 1991/01/16 MRN: [id number]  Date of admission: 12/07/2017 Delivering MD: Nicolette Bang   Date of discharge: 12/09/2017  Admitting diagnosis: 49WKS CTX  Intrauterine pregnancy: [redacted]w[redacted]d     Secondary diagnosis:  Active Problems:   Sickle cell trait ([place])   History of marijuana use   Supervision of normal pregnancy in third trimester   Anemia affecting pregnancy in third trimester   Indication for care in labor or delivery  Additional problems: none     Discharge diagnosis: Term Pregnancy Delivered                                                                                                Post partum procedures:none  Augmentation: none  Complications: None  Hospital course:  Onset of Labor With Vaginal Delivery     27 y.o. yo [id number] at [redacted]w[redacted]d was admitted in Latent Labor on 12/07/2017. Patient had an uncomplicated labor course as follows:  Membrane Rupture Time/Date: 4:41 PM ,12/07/2017   Intrapartum Procedures: Episiotomy: None [1]                                         Lacerations:  None [1]  Patient had a delivery of a Viable infant. 12/07/2017  Information for the patient's newborn:  Mckenzie, Nelson [[id number]]  Delivery Method: Vaginal, Spontaneous(Filed from Delivery Summary)    Pateint had an uncomplicated postpartum course.  She is ambulating, tolerating a regular diet, passing flatus, and urinating well. SW rev'd her chart during her stay- visit was ruled out due to infant with neg UDS. Patient is discharged home in stable condition on 12/09/17.   Physical exam  Vitals:   12/08/17 0855 12/08/17 1706 12/08/17 2129 12/09/17 0609  BP: 119/66 (!) 105/55 (!) 107/58 101/60  Pulse: 81 72 (!) 58 71  Resp: 18 16 16 16   Temp: 98.2 F (36.8 C) 98.2 F (36.8 C) 98 F (36.7 C) 98.1 F (36.7 C)  TempSrc: Oral Oral Oral Oral  SpO2: 99%  100%   Weight:      Height:       General: alert and  cooperative Lochia: appropriate Uterine Fundus: firm Incision: N/A DVT Evaluation: No evidence of DVT seen on physical exam. Labs: Lab Results  Component Value Date   WBC 13.3 (H) 12/07/2017   HGB 9.5 (L) 12/07/2017   HCT 29.0 (L) 12/07/2017   MCV 75.7 (L) 12/07/2017   PLT 225 12/07/2017   CMP Latest Ref Rng & Units 10/31/2017  Glucose 65 - 99 mg/dL 80  BUN 6 - 20 mg/dL -  Creatinine 0.57 - 1.00 mg/dL -  Sodium 134 - 144 mmol/L -  Potassium 3.5 - 5.2 mmol/L -  Chloride 96 - 106 mmol/L -  CO2 20 - 29 mmol/L -  Calcium 8.7 - 10.2 mg/dL -  Total Protein 6.0 - 8.5 g/dL -  Total Bilirubin 0.0 - 1.2 mg/dL -  Alkaline Phos 39 -  117 IU/L -  AST 0 - 40 IU/L -  ALT 0 - 32 IU/L -    Discharge instruction: per After Visit Summary and "Baby and Me Booklet".  After visit meds:  Allergies as of 12/09/2017   No Known Allergies     Medication List    TAKE these medications   ibuprofen 600 MG tablet Commonly known as:  ADVIL,MOTRIN Take 1 tablet (600 mg total) by mouth every 6 (six) hours as needed.       Diet: routine diet  Activity: Advance as tolerated. Pelvic rest for 6 weeks.   Outpatient follow up:4 weeks Follow up Appt: Future Appointments  Date Time Provider [place]  01/04/2018 10:00 AM Aletha Halim, MD [facility]   Follow up Visit:No follow-ups on file.  Postpartum contraception: Progesterone only pills  Newborn Data: Live born female  Birth Weight: 8 lb 9 oz (3885 g) APGAR: 24, 9  Newborn Delivery   Birth date/time:  12/07/2017 16:43:00 Delivery type:  Vaginal, Spontaneous     Baby Feeding: Breast Disposition:home with mother   12/09/2017 Mckenzie Nelson, CNM  7:28 AM

## 2017-12-09 NOTE — Discharge Instructions (Signed)
Vaginal Delivery, Care After °Refer to this sheet in the next few weeks. These instructions provide you with information about caring for yourself after vaginal delivery. Your health care provider may also give you more specific instructions. Your treatment has been planned according to current medical practices, but problems sometimes occur. Call your health care provider if you have any problems or questions. °What can I expect after the procedure? °After vaginal delivery, it is common to have: °· Some bleeding from your vagina. °· Soreness in your abdomen, your vagina, and the area of skin between your vaginal opening and your anus (perineum). °· Pelvic cramps. °· Fatigue. ° °Follow these instructions at home: °Medicines °· Take over-the-counter and prescription medicines only as told by your health care provider. °· If you were prescribed an antibiotic medicine, take it as told by your health care provider. Do not stop taking the antibiotic until it is finished. °Driving ° °· Do not drive or operate heavy machinery while taking prescription pain medicine. °· Do not drive for 24 hours if you received a sedative. °Lifestyle °· Do not drink alcohol. This is especially important if you are breastfeeding or taking medicine to relieve pain. °· Do not use tobacco products, including cigarettes, chewing tobacco, or e-cigarettes. If you need help quitting, ask your health care provider. °Eating and drinking °· Drink at least 8 eight-ounce glasses of water every day unless you are told not to by your health care provider. If you choose to breastfeed your baby, you may need to drink more water than this. °· Eat high-fiber foods every day. These foods may help prevent or relieve constipation. High-fiber foods include: °? Whole grain cereals and breads. °? Brown rice. °? Beans. °? Fresh fruits and vegetables. °Activity °· Return to your normal activities as told by your health care provider. Ask your health care provider  what activities are safe for you. °· Rest as much as possible. Try to rest or take a nap when your baby is sleeping. °· Do not lift anything that is heavier than your baby or 10 lb (4.5 kg) until your health care provider says that it is safe. °· Talk with your health care provider about when you can engage in sexual activity. This may depend on your: °? Risk of infection. °? Rate of healing. °? Comfort and desire to engage in sexual activity. °Vaginal Care °· If you have an episiotomy or a vaginal tear, check the area every day for signs of infection. Check for: °? More redness, swelling, or pain. °? More fluid or blood. °? Warmth. °? Pus or a bad smell. °· Do not use tampons or douches until your health care provider says this is safe. °· Watch for any blood clots that may pass from your vagina. These may look like clumps of dark red, brown, or black discharge. °General instructions °· Keep your perineum clean and dry as told by your health care provider. °· Wear loose, comfortable clothing. °· Wipe from front to back when you use the toilet. °· Ask your health care provider if you can shower or take a bath. If you had an episiotomy or a perineal tear during labor and delivery, your health care provider may tell you not to take baths for a certain length of time. °· Wear a bra that supports your breasts and fits you well. °· If possible, have someone help you with household activities and help care for your baby for at least a few days after   you leave the hospital. °· Keep all follow-up visits for you and your baby as told by your health care provider. This is important. °Contact a health care provider if: °· You have: °? Vaginal discharge that has a bad smell. °? Difficulty urinating. °? Pain when urinating. °? A sudden increase or decrease in the frequency of your bowel movements. °? More redness, swelling, or pain around your episiotomy or vaginal tear. °? More fluid or blood coming from your episiotomy or  vaginal tear. °? Pus or a bad smell coming from your episiotomy or vaginal tear. °? A fever. °? A rash. °? Little or no interest in activities you used to enjoy. °? Questions about caring for yourself or your baby. °· Your episiotomy or vaginal tear feels warm to the touch. °· Your episiotomy or vaginal tear is separating or does not appear to be healing. °· Your breasts are painful, hard, or turn red. °· You feel unusually sad or worried. °· You feel nauseous or you vomit. °· You pass large blood clots from your vagina. If you pass a blood clot from your vagina, save it to show to your health care provider. Do not flush blood clots down the toilet without having your health care provider look at them. °· You urinate more than usual. °· You are dizzy or light-headed. °· You have not breastfed at all and you have not had a menstrual period for 12 weeks after delivery. °· You have stopped breastfeeding and you have not had a menstrual period for 12 weeks after you stopped breastfeeding. °Get help right away if: °· You have: °? Pain that does not go away or does not get better with medicine. °? Chest pain. °? Difficulty breathing. °? Blurred vision or spots in your vision. °? Thoughts about hurting yourself or your baby. °· You develop pain in your abdomen or in one of your legs. °· You develop a severe headache. °· You faint. °· You bleed from your vagina so much that you fill two sanitary pads in one hour. °This information is not intended to replace advice given to you by your health care provider. Make sure you discuss any questions you have with your health care provider. °Document Released: 03/11/2000 Document Revised: 08/26/2015 Document Reviewed: 03/29/2015 °Elsevier Interactive Patient Education © 2018 Elsevier Inc. ° °

## 2017-12-13 ENCOUNTER — Encounter: Payer: Medicaid Other | Admitting: Obstetrics & Gynecology

## 2017-12-17 ENCOUNTER — Inpatient Hospital Stay (HOSPITAL_COMMUNITY): Admission: RE | Admit: 2017-12-17 | Payer: Medicaid Other | Source: Ambulatory Visit

## 2018-01-03 NOTE — Progress Notes (Signed)
Obstetrics and Gynecology Postpartum Visit  Appointment Date: 01/04/2018  OBGYN Clinic: [facility]  Primary Care Provider: Kelton Pillar  Chief Complaint:  Chief Complaint  Patient presents with  . Postpartum Care    History of Present Illness: Mckenzie Nelson is a 27 y.o. African-American [id number] (No LMP recorded.), seen for the above chief complaint. Her past medical history is significant for  trait   She is s/p SVD/IP on 9/12; she was discharged to home on PPD#2  Vaginal bleeding or discharge: ?period starting today Breast or formula feeding: breastfeeding about q2-3h Intercourse: Yes , yesterday. No issues Contraception after delivery: condoms PP depression s/s: No  Pap smear: no abnormalities (date: 2017)  Review of Systems: as noted in the History of Present Illness.  Medications Mckenzie Nelson had no medications administered during this visit. Current Outpatient Medications  Medication Sig Dispense Refill  . ibuprofen (ADVIL,MOTRIN) 600 MG tablet Take 1 tablet (600 mg total) by mouth every 6 (six) hours as needed. (Patient not taking: Reported on 01/04/2018) 30 tablet 0   No current facility-administered medications for this visit.     Allergies Patient has no known allergies.  Physical Exam:  BP 132/80   Pulse 68   Wt 181 lb (82.1 kg)   SpO2 100%   BMI 31.07 kg/m  Body mass index is 31.07 kg/m. General appearance: Well nourished, well developed female in no acute distress.   Laboratory: none  PP Depression Screening:  EPDS score 1  Assessment: pt doing well  Plan:  Routine care. D/w her re: BC options, r/b/a and she'd like to do the pills; she has no contraindications to estrogen use. Patient to abstain x 2wks and if negative, start the OCP. Pt told to consider it effective in one week.   RTC 1 year  Aletha Halim, Brooke Bonito MD Attending Center for [employer] farm [employer])

## 2018-01-04 ENCOUNTER — Encounter: Payer: Self-pay | Admitting: Obstetrics and Gynecology

## 2018-01-04 ENCOUNTER — Ambulatory Visit (INDEPENDENT_AMBULATORY_CARE_PROVIDER_SITE_OTHER): Payer: Medicaid Other | Admitting: Obstetrics and Gynecology

## 2018-01-04 DIAGNOSIS — Z1389 Encounter for screening for other disorder: Secondary | ICD-10-CM

## 2018-01-04 MED ORDER — NORETHIN ACE-ETH ESTRAD-FE 1-20 MG-MCG(24) PO CAPS: 1.0000 | ORAL_CAPSULE | Freq: Every day | ORAL | 3 refills | Status: DC

## 2018-11-06 ENCOUNTER — Encounter: Payer: Self-pay | Admitting: Radiology

## 2019-09-08 ENCOUNTER — Inpatient Hospital Stay (HOSPITAL_COMMUNITY): Payer: Medicaid Other

## 2019-09-08 ENCOUNTER — Encounter (HOSPITAL_COMMUNITY): Payer: Self-pay | Admitting: Emergency Medicine

## 2019-09-08 ENCOUNTER — Inpatient Hospital Stay (HOSPITAL_COMMUNITY)
Admission: EM | Admit: 2019-09-08 | Discharge: 2019-09-08 | Disposition: A | Discharge status: Home / Self Care | Payer: Medicaid Other | Attending: Family Medicine | Admitting: Family Medicine

## 2019-09-08 ENCOUNTER — Other Ambulatory Visit: Payer: Self-pay

## 2019-09-08 DIAGNOSIS — Z87891 Personal history of nicotine dependence: Secondary | ICD-10-CM | POA: Diagnosis not present

## 2019-09-08 DIAGNOSIS — O99011 Anemia complicating pregnancy, first trimester: Secondary | ICD-10-CM | POA: Insufficient documentation

## 2019-09-08 DIAGNOSIS — O209 Hemorrhage in early pregnancy, unspecified: Secondary | ICD-10-CM | POA: Diagnosis present

## 2019-09-08 DIAGNOSIS — D573 Sickle-cell trait: Secondary | ICD-10-CM | POA: Diagnosis not present

## 2019-09-08 DIAGNOSIS — R030 Elevated blood-pressure reading, without diagnosis of hypertension: Secondary | ICD-10-CM | POA: Insufficient documentation

## 2019-09-08 DIAGNOSIS — O26851 Spotting complicating pregnancy, first trimester: Secondary | ICD-10-CM

## 2019-09-08 DIAGNOSIS — O26891 Other specified pregnancy related conditions, first trimester: Secondary | ICD-10-CM | POA: Diagnosis not present

## 2019-09-08 DIAGNOSIS — Z3A01 Less than 8 weeks gestation of pregnancy: Secondary | ICD-10-CM | POA: Diagnosis not present

## 2019-09-08 DIAGNOSIS — O3680X0 Pregnancy with inconclusive fetal viability, not applicable or unspecified: Secondary | ICD-10-CM

## 2019-09-08 DIAGNOSIS — O0990 Supervision of high risk pregnancy, unspecified, unspecified trimester: Secondary | ICD-10-CM

## 2019-09-08 LAB — WET PREP, GENITAL
Sperm: NONE SEEN
Trich, Wet Prep: NONE SEEN
Yeast Wet Prep HPF POC: NONE SEEN

## 2019-09-08 LAB — HCG, QUANTITATIVE, PREGNANCY: hCG, Beta Chain, Quant, S: 7416 m[IU]/mL — ABNORMAL HIGH (ref <5)

## 2019-09-08 LAB — URINALYSIS, ROUTINE W REFLEX MICROSCOPIC
Bilirubin Urine: NEGATIVE
Glucose, UA: NEGATIVE mg/dL
Ketones, ur: NEGATIVE mg/dL
Nitrite: NEGATIVE
Protein, ur: NEGATIVE mg/dL
Specific Gravity, Urine: 1.025 (ref 1.005–1.030)
pH: 7 (ref 5.0–8.0)

## 2019-09-08 LAB — CBC
HCT: 34.7 % — ABNORMAL LOW (ref 36.0–46.0)
Hemoglobin: 12 g/dL (ref 12.0–15.0)
MCH: 27.6 pg (ref 26.0–34.0)
MCHC: 34.6 g/dL (ref 30.0–36.0)
MCV: 79.8 fL — ABNORMAL LOW (ref 80.0–100.0)
Platelets: 320 10*3/uL (ref 150–400)
RBC: 4.35 MIL/uL (ref 3.87–5.11)
RDW: 15.9 % — ABNORMAL HIGH (ref 11.5–15.5)
WBC: 6.8 10*3/uL (ref 4.0–10.5)
nRBC: 0 % (ref 0.0–0.2)

## 2019-09-08 LAB — URINALYSIS, MICROSCOPIC (REFLEX)

## 2019-09-08 LAB — I-STAT BETA HCG BLOOD, ED (MC, WL, AP ONLY): I-stat hCG, quantitative: >2000 m[IU]/mL — ABNORMAL HIGH (ref <5)

## 2019-09-08 NOTE — Discharge Instructions (Signed)
Vaginal Bleeding During Pregnancy, First Trimester  A small amount of bleeding from the vagina (spotting) is relatively common during early pregnancy. It usually stops on its own. Various things may cause bleeding or spotting during early pregnancy. Some bleeding may be related to the pregnancy, and some may not. In many cases, the bleeding is normal and is not a problem. However, bleeding can also be a sign of something serious. Be sure to tell your health care provider about any vaginal bleeding right away. Some possible causes of vaginal bleeding during the first trimester include:  Infection or inflammation of the cervix.  Growths (polyps) on the cervix.  Miscarriage or threatened miscarriage.  Pregnancy tissue developing outside of the uterus (ectopic pregnancy).  A mass of tissue developing in the uterus due to an egg being fertilized incorrectly (molar pregnancy). Follow these instructions at home: Activity  Follow instructions from your health care provider about limiting your activity. Ask what activities are safe for you.  If needed, make plans for someone to help with your regular activities.  Do not have sex or orgasms until your health care provider says that this is safe. General instructions  Take over-the-counter and prescription medicines only as told by your health care provider.  Pay attention to any changes in your symptoms.  Do not use tampons or douche.  Write down how many pads you use each day, how often you change pads, and how soaked (saturated) they are.  If you pass any tissue from your vagina, save the tissue so you can show it to your health care provider.  Keep all follow-up visits as told by your health care provider. This is important. Contact a health care provider if:  You have vaginal bleeding during any part of your pregnancy.  You have cramps or labor pains.  You have a fever. Get help right away if:  You have severe cramps in your  back or abdomen.  You pass large clots or a large amount of tissue from your vagina.  Your bleeding increases.  You feel light-headed or weak, or you faint.  You have chills.  You are leaking fluid or have a gush of fluid from your vagina. Summary  A small amount of bleeding (spotting) from the vagina is relatively common during early pregnancy.  Various things may cause bleeding or spotting in early pregnancy.  Be sure to tell your health care provider about any vaginal bleeding right away. This information is not intended to replace advice given to you by your health care provider. Make sure you discuss any questions you have with your health care provider. Document Revised: 07/03/2018 Document Reviewed: 06/16/2016 Elsevier Patient Education  2020 Elsevier Inc.  

## 2019-09-08 NOTE — ED Provider Notes (Signed)
Patient is a 29 year old female that presents to the emergency department today for lower abdominal pain along with vaginal bleeding that started last night.  Took 2 home pregnancy test 2 weeks ago that were both positive.  Last period was May 1.  Patient has had 2 previous pregnancies without any complications.  No previous miscarriages.  Patient is not complaining of anything else.  Marland Kitchen Physical Exam Constitutional:      General: She is not in acute distress.    Appearance: Normal appearance. She is not ill-appearing, toxic-appearing or diaphoretic.  HENT:     Head: Normocephalic and atraumatic.  Cardiovascular:     Rate and Rhythm: Normal rate and regular rhythm.     Pulses: Normal pulses.     Heart sounds: Normal heart sounds.  Pulmonary:     Effort: Pulmonary effort is normal. No respiratory distress.     Breath sounds: Normal breath sounds.  Abdominal:     General: Bowel sounds are normal. There is no distension.     Palpations: There is no mass.     Tenderness: There is abdominal tenderness (Lower abdominal cramping). There is no guarding or rebound.     Hernia: No hernia is present.  Skin:    General: Skin is warm and dry.  Neurological:     General: No focal deficit present.     Mental Status: She is alert.  Psychiatric:        Mood and Affect: Mood normal.        Thought Content: Thought content normal.        Judgment: Judgment normal.      Patient is medically cleared and ready to go to the MAU.  I spoke to Sam, MAU who is ready for the patient.  Alerted the triage nurse. Santiago Glad.   Alfredia Client, PA-C 09/08/19 1325    Daleen Bo, MD 09/08/19 1948

## 2019-09-08 NOTE — MAU Note (Signed)
Mckenzie Nelson is a 29 y.o. at [redacted]w[redacted]d here in MAU reporting: vaginal bleeding since last night. Bleeding red and pt reports she only sees it when she wipes. Last night had some cramping but denies pain at this time. No abnormal discharge. No recent IC.  LMP: 07/27/19  Onset of complaint: last night  Pain score: 0/10  Vitals:   09/08/19 1239 09/08/19 1359  BP: (!) 143/78 (!) 147/90  Pulse: 89 80  Resp: 16 16  Temp: 98.3 F (36.8 C) 98.4 F (36.9 C)  SpO2: 100% 100%     Lab orders placed from triage: UA

## 2019-09-08 NOTE — MAU Provider Note (Signed)
History     CSN: [id number]  Arrival date and time: 09/08/19 1339  First Provider Initiated Contact with Patient 09/08/19 1404      Chief Complaint  Patient presents with  . Vaginal Bleeding   HPI Mckenzie Nelson is a 29 y.o. G3P2002 at [redacted]w[redacted]d who presents to MAU with chief complaint of vaginal spotting. This is a new problem, onset yesterday 09/07/2019. Patient states she visualized smears of bright red blood when she wiped after voiding. She denies heavy vaginal bleeding. She has not needed to don a pad. She denies abdominal pain, dysuria, abdominal tenderness, fever or recent illness. She is remote from sexual intercourse.    OB History    Gravida  3   Para  2   Term  2   Preterm  0   AB  0   Living  2     SAB  0   TAB  0   Ectopic  0   Multiple  0   Live Births  2           Past Medical History:  Diagnosis Date  . Anemia   . Cellulitis and abscess of buttock 03/12/2013  . False labor 08/21/2016  . Pilonidal cyst   . Sickle cell trait ([place]) 02/11/2016   UCx [ ]  1st trimester:  [ ]  2nd:  [ ]  3rd:    Past Surgical History:  Procedure Laterality Date  . NO PAST SURGERIES      Family History  Problem Relation Age of Onset  . Diabetes Maternal Grandmother   . Stroke Maternal Grandmother   . Emphysema Maternal Grandfather   . Heart disease Maternal Grandfather     Social History   Tobacco Use  . Smoking status: Former Research scientist (life sciences)  . Smokeless tobacco: Never Used  Vaping Use  . Vaping Use: Never used  Substance Use Topics  . Alcohol use: No  . Drug use: No    Allergies: No Known Allergies  Medications Prior to Admission  Medication Sig Dispense Refill Last Dose  . ibuprofen (ADVIL,MOTRIN) 600 MG tablet Take 1 tablet (600 mg total) by mouth every 6 (six) hours as needed. (Patient not taking: Reported on 01/04/2018) 30 tablet 0   . Norethin Ace-Eth Estrad-FE (TAYTULLA) 1-20 MG-MCG(24) CAPS Take 1 tablet by mouth daily. Take a home pregnancy test  and if negative start. Consider pills effective in one week. 90 capsule 3     Review of Systems  Constitutional: Negative for fatigue and fever.  Gastrointestinal: Negative for abdominal pain.  Genitourinary: Positive for vaginal bleeding. Negative for dysuria.  Neurological: Negative for dizziness.  All other systems reviewed and are negative.  Physical Exam   Blood pressure (!) 147/90, pulse 80, temperature 98.4 F (36.9 C), temperature source Oral, resp. rate 16, height 5\' 4"  (1.626 m), weight 81.6 kg, last menstrual period 07/27/2019, SpO2 100 %, unknown if currently breastfeeding.  Physical Exam  Nursing note and vitals reviewed. Constitutional: She does not appear ill.  Respiratory: Effort normal and breath sounds normal.  GI: Soft. Normal appearance and bowel sounds are normal.  Genitourinary:    Genitourinary Comments: Pelvic exam: External genitalia normal, vaginal walls pink and well rugated, cervix visually closed, no lesions, no bleeding noted. Bimanual: cervix 0/thick/posterior, neg CMT, uterus nontender, no adnexal tenderness noted.    Neurological: She is alert.  Skin: Skin is warm and dry.    MAU Course/MDM  Procedures: speculum exam, OB ultrasound  --Clue cells on wet  prep not c/w physical exam. Will not treat for BV. Patient Vitals for the past 24 hrs:  BP Temp Temp src Pulse Resp SpO2 Height Weight  09/08/19 1626 129/73 -- -- 72 -- -- -- --  09/08/19 1359 (!) 147/90 98.4 F (36.9 C) Oral 80 16 100 % -- --  09/08/19 1239 (!) 143/78 98.3 F (36.8 C) Oral 89 16 100 % 5\' 4"  (1.626 m) 81.6 kg   Results for orders placed or performed during the hospital encounter of 09/08/19 (from the past 24 hour(s))  I-Stat beta hCG blood, ED     Status: Abnormal   Collection Time: 09/08/19 12:54 PM  Result Value Ref Range   I-stat hCG, quantitative >2,000.0 (H) <5 mIU/mL   Comment 3          CBC     Status: Abnormal   Collection Time: 09/08/19  2:16 PM  Result Value Ref  Range   WBC 6.8 4.0 - 10.5 K/uL   RBC 4.35 3.87 - 5.11 MIL/uL   Hemoglobin 12.0 12.0 - 15.0 g/dL   HCT 34.7 (L) 36 - 46 %   MCV 79.8 (L) 80.0 - 100.0 fL   MCH 27.6 26.0 - 34.0 pg   MCHC 34.6 30.0 - 36.0 g/dL   RDW 15.9 (H) 11.5 - 15.5 %   Platelets 320 150 - 400 K/uL   nRBC 0.0 0.0 - 0.2 %  hCG, quantitative, pregnancy     Status: Abnormal   Collection Time: 09/08/19  2:16 PM  Result Value Ref Range   hCG, Beta Chain, Quant, S 7,416 (H) <5 mIU/mL  Urinalysis, Routine w reflex microscopic     Status: Abnormal   Collection Time: 09/08/19  2:41 PM  Result Value Ref Range   Color, Urine YELLOW YELLOW   APPearance HAZY (A) CLEAR   Specific Gravity, Urine 1.025 1.005 - 1.030   pH 7.0 5.0 - 8.0   Glucose, UA NEGATIVE NEGATIVE mg/dL   Hgb urine dipstick LARGE (A) NEGATIVE   Bilirubin Urine NEGATIVE NEGATIVE   Ketones, ur NEGATIVE NEGATIVE mg/dL   Protein, ur NEGATIVE NEGATIVE mg/dL   Nitrite NEGATIVE NEGATIVE   Leukocytes,Ua TRACE (A) NEGATIVE  Urinalysis, Microscopic (reflex)     Status: Abnormal   Collection Time: 09/08/19  2:41 PM  Result Value Ref Range   RBC / HPF 0-5 0 - 5 RBC/hpf   WBC, UA 0-5 0 - 5 WBC/hpf   Bacteria, UA FEW (A) NONE SEEN   Squamous Epithelial / LPF 6-10 0 - 5   Mucus PRESENT   Wet prep, genital     Status: Abnormal   Collection Time: 09/08/19  3:48 PM   Specimen: PATH Cytology Cervicovaginal Ancillary Only  Result Value Ref Range   Yeast Wet Prep HPF POC NONE SEEN NONE SEEN   Trich, Wet Prep NONE SEEN NONE SEEN   Clue Cells Wet Prep HPF POC PRESENT (A) NONE SEEN   WBC, Wet Prep HPF POC FEW (A) NONE SEEN   Sperm NONE SEEN    US OB LESS THAN 14 WEEKS WITH OB TRANSVAGINAL  Result Date: 09/08/2019 CLINICAL DATA:  Vaginal bleeding EXAM: OBSTETRIC <14 WK [place] AND TRANSVAGINAL OB US TECHNIQUE: Both transabdominal and transvaginal ultrasound examinations were performed for complete evaluation of the gestation as well as the maternal uterus, adnexal regions,  and pelvic cul-de-sac. Transvaginal technique was performed to assess early pregnancy. COMPARISON:  None. FINDINGS: Intrauterine gestational sac: Visualized Yolk sac: Not visualized Embryo:  Visualized  Cardiac Activity: Not visualized CRL:  4 mm   6 w   0 d Subchorionic hemorrhage:  None visualized. Maternal uterus/adnexae: Cervical os is closed. There is a partially calcified leiomyoma in the superior uterine fundus measuring 1.4 x 1.4 x 1.3 cm. Right ovary measures 3.4 x 2.1 x 1.9 cm. Left ovary measures 2.4 x 1.3 x 2.3 cm. There is no extrauterine pelvic or adnexal mass. No free pelvic fluid. IMPRESSION: 1. A 4 mm in length gestational sac is seen without fetal heart activity. Findings are suspicious but not yet definitive for failed pregnancy. Recommend follow-up US in 10-14 days for definitive diagnosis. This recommendation follows SRU consensus guidelines: Diagnostic Criteria for Nonviable Pregnancy Early in the First Trimester. [place] Med 2013; 867:6195-09. 2. Partially calcified leiomyoma in the superior uterine fundus measuring 1.4 x 1.4 x 1.3 cm. 3.  Study otherwise unremarkable. Electronically Signed   By: Lowella Grip III M.D.   On: 09/08/2019 15:58   Assessment and Plan  --29 y.o. G3P2002 at [redacted]w[redacted]d  --Per Radiology report, concern for fetal viability --Blood type B POS --Elevated BP x 2 without history of Hypertension --Discharge home in stable condition with bleeding precautions  F/U: --Order placed for repeat US in 10-14 days  Darlina Rumpf, CNM 09/08/2019, 6:08 PM

## 2019-09-08 NOTE — ED Triage Notes (Signed)
Pt reports vaginal bleeding since last night.  Took + home preg test 2 weeks ago.  Abd cramping last night but denies at present.

## 2019-09-09 LAB — GC/CHLAMYDIA PROBE AMP (~~LOC~~) NOT AT ARMC
Chlamydia: NEGATIVE
Comment: NEGATIVE
Comment: NORMAL
Neisseria Gonorrhea: NEGATIVE

## 2019-10-29 ENCOUNTER — Ambulatory Visit (INDEPENDENT_AMBULATORY_CARE_PROVIDER_SITE_OTHER): Payer: Self-pay | Admitting: Obstetrics and Gynecology

## 2019-10-29 ENCOUNTER — Encounter: Payer: Self-pay | Admitting: Radiology

## 2019-10-29 ENCOUNTER — Other Ambulatory Visit: Payer: Self-pay

## 2019-10-29 VITALS — BP 146/89 | HR 103

## 2019-10-29 DIAGNOSIS — O3110X0 Continuing pregnancy after spontaneous abortion of one fetus or more, unspecified trimester, not applicable or unspecified: Secondary | ICD-10-CM

## 2019-10-29 DIAGNOSIS — O3110X1 Continuing pregnancy after spontaneous abortion of one fetus or more, unspecified trimester, fetus 1: Secondary | ICD-10-CM

## 2019-10-29 DIAGNOSIS — O3680X2 Pregnancy with inconclusive fetal viability, fetus 2: Secondary | ICD-10-CM

## 2019-10-29 NOTE — Progress Notes (Signed)
Pt here today for to confirm her pregnancy. Pt was pregnant in June, and believes she had a miscarriage after her MAU visit on 6/13. Pt states she had a week of bleeding and hasn't bled anymore since then. Pt took a home UPT this week and it was positive, and so she believes this is a new pregnancy.  Ultrasound performed, Dr Ilda Basset at bedside to assist with ultrasound.

## 2019-10-29 NOTE — Progress Notes (Signed)
Obstetrics and Gynecology Visit Return Patient Evaluation  Appointment Date: 10/29/2019  Primary Care Provider: Kelton Pillar  OBGYN Clinic: [facility]  Chief Complaint: u/s for viability  History of Present Illness:  Mckenzie Nelson is a 29 y.o. pt seen in MAU on 6/13 for VB in early pregnancy and had a YS but no fetal yet. Pt set up for 10-14 day viability but pt didn't go b/c she had further bleeding c/w SAB. She had a ?+UPT at home and is here today for evaluation.   Review of Systems: as noted in the History of Present Illness.  Medications:  [name] had no medications administered during this visit. No current outpatient medications on file.   No current facility-administered medications for this visit.    Allergies: has No Known Allergies.  Physical Exam:  BP (!) 146/89   Pulse (!) 103   Breastfeeding Unknown  There is no height or weight on file to calculate BMI. General appearance: Well nourished, well developed female in no acute distress.  Abdomen: nttp Neuro/Psych:  Normal mood and affect.    Radiology TVUS: appears to have IUP with di-di twins but only one sac with a YS and what appears to be a fetal pole with ? Cardiac motion. Other sac is large, oblong and no YS or FP seen. RO not seen but LO seen with likely CL cyst that has no AV flow and measures approximately 1.5cm with overall LO size approximately 2-3cm. No FF in the pelvis, uterus mid plane   Assessment: likey vanishing twin pregnancy in new pregnancy at approx 5 weeks. Pt stable.   Plan:  1. Pregnancy with uncertain fetal viability, fetus 2 of multiple gestation Recommend formal u/s in 7-10 days. Schedule NOB PRN after that - [place] MFM OB Comp Less 14 Wks; Future   Durene Romans MD Attending Center for [facility])

## 2019-10-29 NOTE — Addendum Note (Signed)
Addended by: [name] on: 10/29/2019 02:29 PM   Modules accepted: Orders

## 2019-11-05 ENCOUNTER — Other Ambulatory Visit: Payer: Self-pay

## 2019-11-05 ENCOUNTER — Other Ambulatory Visit: Payer: Self-pay | Admitting: Obstetrics and Gynecology

## 2019-11-05 ENCOUNTER — Ambulatory Visit: Payer: Self-pay

## 2019-11-05 ENCOUNTER — Ambulatory Visit
Admission: RE | Admit: 2019-11-05 | Discharge: 2019-11-05 | Disposition: A | Discharge status: Home / Self Care | Payer: Medicaid Other | Source: Ambulatory Visit | Attending: Advanced Practice Midwife | Admitting: Advanced Practice Midwife

## 2019-11-05 DIAGNOSIS — O3680X2 Pregnancy with inconclusive fetal viability, fetus 2: Secondary | ICD-10-CM

## 2019-11-05 DIAGNOSIS — O3110X0 Continuing pregnancy after spontaneous abortion of one fetus or more, unspecified trimester, not applicable or unspecified: Secondary | ICD-10-CM | POA: Insufficient documentation

## 2019-11-05 DIAGNOSIS — O209 Hemorrhage in early pregnancy, unspecified: Secondary | ICD-10-CM | POA: Diagnosis present

## 2019-11-06 ENCOUNTER — Encounter: Payer: Self-pay | Admitting: Obstetrics and Gynecology

## 2019-11-06 DIAGNOSIS — O30049 Twin pregnancy, dichorionic/diamniotic, unspecified trimester: Secondary | ICD-10-CM | POA: Insufficient documentation

## 2019-12-23 ENCOUNTER — Other Ambulatory Visit: Payer: Self-pay

## 2019-12-23 ENCOUNTER — Encounter: Payer: Self-pay | Admitting: Obstetrics & Gynecology

## 2019-12-23 ENCOUNTER — Ambulatory Visit (INDEPENDENT_AMBULATORY_CARE_PROVIDER_SITE_OTHER): Payer: Medicaid Other | Admitting: Obstetrics & Gynecology

## 2019-12-23 ENCOUNTER — Other Ambulatory Visit (HOSPITAL_COMMUNITY)
Admission: RE | Admit: 2019-12-23 | Discharge: 2019-12-23 | Disposition: A | Discharge status: Home / Self Care | Payer: Medicaid Other | Source: Ambulatory Visit | Attending: Obstetrics & Gynecology | Admitting: Obstetrics & Gynecology

## 2019-12-23 ENCOUNTER — Encounter: Payer: Self-pay | Admitting: Radiology

## 2019-12-23 VITALS — BP 151/84 | HR 166 | Wt 180.0 lb

## 2019-12-23 DIAGNOSIS — Z3A14 14 weeks gestation of pregnancy: Secondary | ICD-10-CM | POA: Insufficient documentation

## 2019-12-23 DIAGNOSIS — O0992 Supervision of high risk pregnancy, unspecified, second trimester: Secondary | ICD-10-CM

## 2019-12-23 DIAGNOSIS — Z01419 Encounter for gynecological examination (general) (routine) without abnormal findings: Secondary | ICD-10-CM | POA: Insufficient documentation

## 2019-12-23 DIAGNOSIS — O30042 Twin pregnancy, dichorionic/diamniotic, second trimester: Secondary | ICD-10-CM

## 2019-12-23 DIAGNOSIS — O10919 Unspecified pre-existing hypertension complicating pregnancy, unspecified trimester: Secondary | ICD-10-CM

## 2019-12-23 MED ORDER — NIFEDIPINE ER OSMOTIC RELEASE 30 MG PO TB24: 30.0000 mg | ORAL_TABLET | Freq: Every day | ORAL | 2 refills | Status: DC

## 2019-12-23 MED ORDER — ASPIRIN EC 81 MG PO TBEC: 81.0000 mg | DELAYED_RELEASE_TABLET | Freq: Every day | ORAL | 2 refills | Status: DC

## 2019-12-23 NOTE — Progress Notes (Signed)
History:   Mckenzie Nelson is a 29 y.o. G3P2002 at [redacted]w[redacted]d with dichorionic, diamniotic twin gestationby early ultrasound being seen today for her first obstetrical visit.  Her obstetrical history is significant for term SVDs, no complications during pregnancy. For this pregnancy, patient had significant nausea and vomiting which is now resolved. Also noted to have elevated BP during two previous encounters on 09/08/19 and 10/29/2019, no history of hypertensive disorders.  Patient does intend to breast feed. Pregnancy history fully reviewed.  Patient reports no complaints.      HISTORY: OB History  Gravida Para Term Preterm AB Living  3 2 2  0 0 2  SAB TAB Ectopic Multiple Live Births  0 0 0 0 2    # Outcome Date GA Lbr Len/2nd Weight Sex Delivery Anes PTL Lv  3 Current           2 Term 12/07/17 [redacted]w[redacted]d 11:12 / 00:01 8 lb 9 oz (3.885 kg) F Vag-Spont None  LIV     Name: Musgrave,GIRL Rosalinda     Apgar1: 9  Apgar5: 9  1 Term 08/22/16 [redacted]w[redacted]d 32:28 / 00:13 8 lb 13.1 oz (4 kg) M Vag-Spont EPI  LIV     Name: Fojtik,BOY Rio     Apgar1: 9  Apgar5: 9    Last pap smear was done 2017 and was normal  Past Medical History:  Diagnosis Date  . Anemia   . Cellulitis and abscess of buttock 03/12/2013  . Pilonidal cyst   . Sickle cell trait ([place]) 02/11/2016   Past Surgical History:  Procedure Laterality Date  . NO PAST SURGERIES     Family History  Problem Relation Age of Onset  . Diabetes Maternal Grandmother   . Stroke Maternal Grandmother   . Emphysema Maternal Grandfather   . Heart disease Maternal Grandfather    Social History   Tobacco Use  . Smoking status: Former Research scientist (life sciences)  . Smokeless tobacco: Never Used  Vaping Use  . Vaping Use: Never used  Substance Use Topics  . Alcohol use: No  . Drug use: No   No Known Allergies Current Outpatient Medications on File Prior to Visit  Medication Sig Dispense Refill  . Prenatal Vit-Fe Fumarate-FA (MULTIVITAMIN-PRENATAL) 27-0.8 MG TABS  tablet Take 1 tablet by mouth daily at 12 noon.     No current facility-administered medications on file prior to visit.    Review of Systems Pertinent items noted in HPI and remainder of comprehensive ROS otherwise negative. Physical Exam:   Vitals:   12/23/19 1445  BP: (!) 151/84  Pulse: (!) 166  Weight: 180 lb (81.6 kg)   Fetal Heart Rate (bpm): 154/160 on bedside ultrasound  Uterus:     Pelvic Exam: Perineum: no hemorrhoids, normal perineum   Vulva: normal external genitalia, no lesions   Vagina:  normal mucosa, normal discharge   Cervix: no lesions and normal, pap smear done.    Adnexa: normal adnexa and no mass, fullness, tenderness   Bony Pelvis: average  System: General: well-developed, well-nourished female in no acute distress   Breasts:  normal appearance, no masses or tenderness bilaterally   Skin: normal coloration and turgor, no rashes   Neurologic: oriented, normal, negative, normal mood   Extremities: normal strength, tone, and muscle mass, ROM of all joints is normal   HEENT PERRLA, extraocular movement intact and sclera clear, anicteric   Neck supple and no masses   Cardiovascular: regular rate and rhythm   Respiratory:  no respiratory distress,  normal breath sounds   Abdomen: soft, non-tender; bowel sounds normal; no masses,  no organomegaly    Assessment:    Pregnancy: [id number] Patient Active Problem List   Diagnosis Date Noted  . Preexisting hypertension complicating pregnancy, antepartum 12/23/2019  . Dichorionic diamniotic twin gestation 11/06/2019  . Supervision of high-risk pregnancy 09/08/2019  . Sickle cell trait in mother affecting pregnancy ([place]) 02/11/2016     Plan:    1. Dichorionic diamniotic twin pregnancy in second trimester Emphasized that this is a high risk pregnancy, increased risk of PTL,HTN complications, GDM. Aspirin prescribed.  Will follow closely, will also need serial scans.  2. Preexisting hypertension complicating  pregnancy, antepartum Discussed implications of CHTN in pregnancy, need for antenatal testing and frequent ultrasounds/prenatal visits, need for optimizing BP control to decrease CHTN/preeclampsia associated maternal-fetal morbidity and mortality. Will check baseline labs today.  Procardia also started today given BP 151/84. Will monitor response and follow BP closely.  - NIFEdipine (PROCARDIA-XL/NIFEDICAL-XL) 30 MG 24 hr tablet; Take 1 tablet (30 mg total) by mouth daily.  Dispense: 30 tablet; Refill: 2 - Comprehensive metabolic panel - Protein / creatinine ratio, urine - aspirin EC 81 MG tablet; Take 1 tablet (81 mg total) by mouth daily. Take after 12 weeks for prevention of preeclampsia later in pregnancy  Dispense: 300 tablet; Refill: 2  3. [redacted] weeks gestation of pregnancy 4. Supervision of high risk pregnancy in second trimester - Cytology - PAP - CBC/D/Plt+RPR+Rh+ABO+Rub Ab... - Culture, OB Urine - [place] MFM OB DETAIL +14 WK; Future - [place] MFM OB DETAIL ADDL GEST +14 WK; Future - Hemoglobin A1c - TSH - Protein / creatinine ratio, urine - Enroll Patient in Babyscripts Initial labs drawn. Continue prenatal vitamins. Problem list reviewed and updated. Genetic Screening discussed, First trimester screen, Quad screen and NIPS: declined. Ultrasound discussed; fetal anatomic survey: ordered. Flu and COVID vaccines discussed; declined both.  Anticipatory guidance about prenatal visits given including labs, ultrasounds, and testing. Discussed usage of Babyscripts and virtual visits as additional source of managing and completing prenatal visits in midst of coronavirus and pandemic.   Encouraged to complete MyChart Registration for her ability to review results, send requests, and have questions addressed.  The nature of [place] [facility] with multiple MDs and Advanced Practice Providers was explained to patient; also emphasized that residents, students  are part of our team. Routine obstetric precautions reviewed. Encouraged to seek out care at office or emergency room [facility] MAU preferred) for urgent and/or emergent concerns. Return in about 2 weeks (around 01/06/2020) for Virtual OB visit, BP follow up.     Verita Schneiders, MD, [place] for [facility], [place]

## 2019-12-23 NOTE — Addendum Note (Signed)
Addended by: [name] on: 12/23/2019 03:35 PM   Modules accepted: Orders

## 2019-12-23 NOTE — Patient Instructions (Signed)

## 2019-12-24 LAB — COMPREHENSIVE METABOLIC PANEL
ALT: 6 IU/L (ref 0–32)
AST: 10 IU/L (ref 0–40)
Albumin/Globulin Ratio: 0.9 — ABNORMAL LOW (ref 1.2–2.2)
Albumin: 3.8 g/dL — ABNORMAL LOW (ref 3.9–5.0)
Alkaline Phosphatase: 93 IU/L (ref 44–121)
BUN/Creatinine Ratio: 7 — ABNORMAL LOW (ref 9–23)
BUN: 4 mg/dL — ABNORMAL LOW (ref 6–20)
Bilirubin Total: 0.3 mg/dL (ref 0.0–1.2)
CO2: 20 mmol/L (ref 20–29)
Calcium: 8.9 mg/dL (ref 8.7–10.2)
Chloride: 102 mmol/L (ref 96–106)
Creatinine, Ser: 0.54 mg/dL — ABNORMAL LOW (ref 0.57–1.00)
GFR calc Af Amer: 149 mL/min/{1.73_m2} (ref >59)
GFR calc non Af Amer: 129 mL/min/{1.73_m2} (ref >59)
Globulin, Total: 4.1 g/dL (ref 1.5–4.5)
Glucose: 93 mg/dL (ref 65–99)
Potassium: 3.6 mmol/L (ref 3.5–5.2)
Sodium: 138 mmol/L (ref 134–144)
Total Protein: 7.9 g/dL (ref 6.0–8.5)

## 2019-12-24 LAB — PROTEIN / CREATININE RATIO, URINE
Creatinine, Urine: 246.6 mg/dL
Protein, Ur: 22.2 mg/dL
Protein/Creat Ratio: 90 mg/g creat (ref 0–200)

## 2019-12-24 LAB — TSH: TSH: 3.14 u[IU]/mL (ref 0.450–4.500)

## 2019-12-24 LAB — CBC/D/PLT+RPR+RH+ABO+RUB AB...
Antibody Screen: NEGATIVE
Basophils Absolute: 0 10*3/uL (ref 0.0–0.2)
Basos: 0 %
EOS (ABSOLUTE): 0.1 10*3/uL (ref 0.0–0.4)
Eos: 1 %
HCV Ab: <0.1 s/co ratio (ref 0.0–0.9)
HIV Screen 4th Generation wRfx: NONREACTIVE
Hematocrit: 34.7 % (ref 34.0–46.6)
Hemoglobin: 12 g/dL (ref 11.1–15.9)
Hepatitis B Surface Ag: NEGATIVE
Immature Grans (Abs): 0 10*3/uL (ref 0.0–0.1)
Immature Granulocytes: 0 %
Lymphocytes Absolute: 1.4 10*3/uL (ref 0.7–3.1)
Lymphs: 15 %
MCH: 29.4 pg (ref 26.6–33.0)
MCHC: 34.6 g/dL (ref 31.5–35.7)
MCV: 85 fL (ref 79–97)
Monocytes Absolute: 0.6 10*3/uL (ref 0.1–0.9)
Monocytes: 6 %
Neutrophils Absolute: 7.7 10*3/uL — ABNORMAL HIGH (ref 1.4–7.0)
Neutrophils: 78 %
Platelets: 349 10*3/uL (ref 150–450)
RBC: 4.08 x10E6/uL (ref 3.77–5.28)
RDW: 14.6 % (ref 11.7–15.4)
RPR Ser Ql: NONREACTIVE
Rh Factor: POSITIVE
Rubella Antibodies, IGG: 27.4 index (ref >0.99)
WBC: 9.7 10*3/uL (ref 3.4–10.8)

## 2019-12-24 LAB — HEMOGLOBIN A1C
Est. average glucose Bld gHb Est-mCnc: 88 mg/dL
Hgb A1c MFr Bld: 4.7 % — ABNORMAL LOW (ref 4.8–5.6)

## 2019-12-24 LAB — HCV INTERPRETATION

## 2019-12-25 LAB — CYTOLOGY - PAP
Chlamydia: NEGATIVE
Comment: NEGATIVE
Comment: NEGATIVE
Comment: NORMAL
Diagnosis: NEGATIVE
Neisseria Gonorrhea: NEGATIVE
Trichomonas: NEGATIVE

## 2019-12-25 LAB — CULTURE, OB URINE

## 2019-12-25 LAB — URINE CULTURE, OB REFLEX

## 2020-01-06 ENCOUNTER — Telehealth: Payer: Self-pay | Admitting: Radiology

## 2020-01-06 NOTE — Telephone Encounter (Signed)
Patient called stating that she had a positive COVID 19 test yesterday 01/04/18. Symptoms started on 01/03/20, stuffy nose, weakness, loss of taste and smell. States that she is feeling better today, still no taste or smell and still has congestion. No SOB and weakness has subsided. Patient was instructed to use over the counter medications that are listed on the pregnancy safe medication list provided by the office and to stay hydrated. Next OB appointment is virtual and ultrasound is not till 01/28/20, no changes made to her appointments. Patient was instructed to stay quarantined per COVID 19 protocol.

## 2020-01-09 ENCOUNTER — Other Ambulatory Visit: Payer: Self-pay

## 2020-01-09 ENCOUNTER — Telehealth (INDEPENDENT_AMBULATORY_CARE_PROVIDER_SITE_OTHER): Payer: Medicaid Other | Admitting: Obstetrics & Gynecology

## 2020-01-09 VITALS — BP 117/79

## 2020-01-09 DIAGNOSIS — O0992 Supervision of high risk pregnancy, unspecified, second trimester: Secondary | ICD-10-CM

## 2020-01-09 DIAGNOSIS — O98512 Other viral diseases complicating pregnancy, second trimester: Secondary | ICD-10-CM

## 2020-01-09 DIAGNOSIS — O219 Vomiting of pregnancy, unspecified: Secondary | ICD-10-CM

## 2020-01-09 DIAGNOSIS — O10919 Unspecified pre-existing hypertension complicating pregnancy, unspecified trimester: Secondary | ICD-10-CM

## 2020-01-09 DIAGNOSIS — U071 COVID-19: Secondary | ICD-10-CM

## 2020-01-09 DIAGNOSIS — O30042 Twin pregnancy, dichorionic/diamniotic, second trimester: Secondary | ICD-10-CM

## 2020-01-09 DIAGNOSIS — O10912 Unspecified pre-existing hypertension complicating pregnancy, second trimester: Secondary | ICD-10-CM

## 2020-01-09 DIAGNOSIS — Z3A16 16 weeks gestation of pregnancy: Secondary | ICD-10-CM

## 2020-01-09 MED ORDER — ONDANSETRON 4 MG PO TBDP: 4.0000 mg | ORAL_TABLET | Freq: Four times a day (QID) | ORAL | 3 refills | Status: DC | PRN

## 2020-01-09 NOTE — Progress Notes (Signed)
I connected with  Mckenzie Nelson on 01/09/20 at  2:45 PM EDT by telephone and verified that I am speaking with the correct person using two identifiers.   I discussed the limitations, risks, security and privacy concerns of performing an evaluation and management service by telephone and the availability of in person appointments. I also discussed with the patient that there may be a patient responsible charge related to this service. The patient expressed understanding and agreed to proceed.  [name], RN 01/09/2020  2:39 PM

## 2020-01-09 NOTE — Progress Notes (Signed)
OBSTETRICS PRENATAL VIRTUAL VISIT ENCOUNTER NOTE  Provider location: [facility] [place] [facility]   I connected with [name] on 01/09/20 at  2:45 PM EDT by MyChart Video Encounter at home and verified that I am speaking with the correct person using two identifiers.   I discussed the limitations, risks, security and privacy concerns of performing an evaluation and management service virtually and the availability of in person appointments. I also discussed with the patient that there may be a patient responsible charge related to this service. The patient expressed understanding and agreed to proceed. Subjective:  Mckenzie Nelson is a 29 y.o. G3P2002 at [redacted]w[redacted]d being seen today for ongoing prenatal care.  She is currently monitored for the following issues for this high-risk pregnancy and has Sickle cell trait in mother affecting pregnancy ([place]); Supervision of high-risk pregnancy; Dichorionic diamniotic twin gestation; Preexisting hypertension complicating pregnancy, antepartum; and COVID-19 affecting pregnancy in second trimester on their problem list.  Patient reports active COVID infection, had positive test on 01/06/20.  Only reports congestion, no fevers, no SOB, no CP. Only reports increased nausea and vomiting.  Children are doing okay.   Contractions: Not present. Vag. Bleeding: None.   . Denies any leaking of fluid.   The following portions of the patient's history were reviewed and updated as appropriate: allergies, current medications, past family history, past medical history, past social history, past surgical history and problem list.   Objective:   Vitals:   01/09/20 1439  BP: 117/79    Fetal Status:           General:  Alert, oriented and cooperative. Patient is in no acute distress.  Respiratory: Normal respiratory effort, no problems with respiration noted  Mental Status: Normal mood and affect. Normal behavior. Normal judgment and thought content.   Rest of physical exam deferred due to type of encounter  Imaging: No results found.  Assessment and Plan:  Pregnancy: G3P2002 at [redacted]w[redacted]d 1. COVID-19 affecting pregnancy in second trimester Counseled patient about monoclonal antibody therapy and need to avoid progression of infection, strongly recommended this. She declined this therapy.  She was strongly advised to come in for any worsening symptoms.  2. Nausea and vomiting of pregnancy, antepartum Antiemetic prescribed, advised to let someone pick this up and get it to her. - ondansetron (ZOFRAN ODT) 4 MG disintegrating tablet; Take 1 tablet (4 mg total) by mouth every 6 (six) hours as needed for nausea.  Dispense: 30 tablet; Refill: 3  3. Preexisting hypertension complicating pregnancy, antepartum Stable BP on Procardia.  4. Dichorionic diamniotic twin pregnancy in second trimester Scheduled for anatomy scan next month  5. [redacted] weeks gestation of pregnancy 6. Supervision of high risk pregnancy in second trimester No other acute complaints or concerns.  Routine obstetric precautions reviewed.  I discussed the assessment and treatment plan with the patient. The patient was provided an opportunity to ask questions and all were answered. The patient agreed with the plan and demonstrated an understanding of the instructions. The patient was advised to call back or seek an in-person office evaluation/go to MAU at [facility] for any urgent or concerning symptoms. Please refer to After Visit Summary for other counseling recommendations.   I provided 12 minutes of face-to-face time during this encounter.  No follow-ups on file.  Future Appointments  Date Time Provider [place]  01/28/2020 10:15 AM [facility] NURSE [facility]  01/28/2020 10:30 AM [facility] US3 [facility]  02/06/2020  9:00 AM Aletha Halim, MD [facility]    Verita Schneiders, MD Center for [facility], [place]

## 2020-01-09 NOTE — Patient Instructions (Signed)
Return to office for any scheduled appointments. Call the office or go to the MAU at [place] at [facility] if:  You begin to have strong, frequent contractions  Your water breaks.  Sometimes it is a big gush of fluid, sometimes it is just a trickle that keeps getting your panties wet or running down your legs  You have vaginal bleeding.  It is normal to have a small amount of spotting if your cervix was checked.   Any other obstetric concerns.  10 Things You Can Do to Manage Your COVID-19 Symptoms at Home If you have possible or confirmed COVID-19: 1. Stay home from work and school. And stay away from other public places. If you must go out, avoid using any kind of public transportation, ridesharing, or taxis. 2. Monitor your symptoms carefully. If your symptoms get worse, call your healthcare provider immediately. 3. Get rest and stay hydrated. 4. If you have a medical appointment, call the healthcare provider ahead of time and tell them that you have or may have COVID-19. 5. For medical emergencies, call 911 and notify the dispatch personnel that you have or may have COVID-19. 6. Cover your cough and sneezes with a tissue or use the inside of your elbow. 7. Wash your hands often with soap and water for at least 20 seconds or clean your hands with an alcohol-based hand sanitizer that contains at least 60% alcohol. 8. As much as possible, stay in a specific room and away from other people in your home. Also, you should use a separate bathroom, if available. If you need to be around other people in or outside of the home, wear a mask. 9. Avoid sharing personal items with other people in your household, like dishes, towels, and bedding. 10. Clean all surfaces that are touched often, like counters, tabletops, and doorknobs. Use household cleaning sprays or wipes according to the label instructions. [email or web address] 09/26/2018 This information is not intended to replace  advice given to you by your health care provider. Make sure you discuss any questions you have with your health care provider. Document Revised: 02/28/2019 Document Reviewed: 02/28/2019 Elsevier Patient Education  [place].

## 2020-01-28 ENCOUNTER — Other Ambulatory Visit: Payer: Self-pay | Admitting: *Deleted

## 2020-01-28 ENCOUNTER — Other Ambulatory Visit: Payer: Self-pay

## 2020-01-28 ENCOUNTER — Encounter: Payer: Self-pay | Admitting: *Deleted

## 2020-01-28 ENCOUNTER — Ambulatory Visit: Payer: Medicaid Other | Attending: Obstetrics & Gynecology

## 2020-01-28 ENCOUNTER — Ambulatory Visit: Payer: Medicaid Other | Admitting: *Deleted

## 2020-01-28 DIAGNOSIS — D573 Sickle-cell trait: Secondary | ICD-10-CM | POA: Insufficient documentation

## 2020-01-28 DIAGNOSIS — O350XX1 Maternal care for (suspected) central nervous system malformation in fetus, fetus 1: Secondary | ICD-10-CM | POA: Insufficient documentation

## 2020-01-28 DIAGNOSIS — O10919 Unspecified pre-existing hypertension complicating pregnancy, unspecified trimester: Secondary | ICD-10-CM

## 2020-01-28 DIAGNOSIS — O10912 Unspecified pre-existing hypertension complicating pregnancy, second trimester: Secondary | ICD-10-CM | POA: Diagnosis present

## 2020-01-28 DIAGNOSIS — Z3A20 20 weeks gestation of pregnancy: Secondary | ICD-10-CM | POA: Insufficient documentation

## 2020-01-28 DIAGNOSIS — O10012 Pre-existing essential hypertension complicating pregnancy, second trimester: Secondary | ICD-10-CM | POA: Diagnosis not present

## 2020-01-28 DIAGNOSIS — O30042 Twin pregnancy, dichorionic/diamniotic, second trimester: Secondary | ICD-10-CM | POA: Insufficient documentation

## 2020-01-28 DIAGNOSIS — Z3A14 14 weeks gestation of pregnancy: Secondary | ICD-10-CM | POA: Diagnosis not present

## 2020-01-28 DIAGNOSIS — Z3A19 19 weeks gestation of pregnancy: Secondary | ICD-10-CM | POA: Diagnosis not present

## 2020-01-28 DIAGNOSIS — O99012 Anemia complicating pregnancy, second trimester: Secondary | ICD-10-CM | POA: Insufficient documentation

## 2020-02-06 ENCOUNTER — Other Ambulatory Visit: Payer: Self-pay

## 2020-02-06 ENCOUNTER — Telehealth (INDEPENDENT_AMBULATORY_CARE_PROVIDER_SITE_OTHER): Payer: Medicaid Other | Admitting: Obstetrics and Gynecology

## 2020-02-06 VITALS — BP 120/81 | HR 125

## 2020-02-06 DIAGNOSIS — D573 Sickle-cell trait: Secondary | ICD-10-CM | POA: Diagnosis not present

## 2020-02-06 DIAGNOSIS — O30042 Twin pregnancy, dichorionic/diamniotic, second trimester: Secondary | ICD-10-CM

## 2020-02-06 DIAGNOSIS — O10919 Unspecified pre-existing hypertension complicating pregnancy, unspecified trimester: Secondary | ICD-10-CM

## 2020-02-06 DIAGNOSIS — O10912 Unspecified pre-existing hypertension complicating pregnancy, second trimester: Secondary | ICD-10-CM

## 2020-02-06 DIAGNOSIS — Z3A20 20 weeks gestation of pregnancy: Secondary | ICD-10-CM

## 2020-02-06 DIAGNOSIS — O99012 Anemia complicating pregnancy, second trimester: Secondary | ICD-10-CM

## 2020-02-06 DIAGNOSIS — O0992 Supervision of high risk pregnancy, unspecified, second trimester: Secondary | ICD-10-CM

## 2020-02-06 DIAGNOSIS — O98512 Other viral diseases complicating pregnancy, second trimester: Secondary | ICD-10-CM

## 2020-02-06 NOTE — Progress Notes (Signed)
   TELEHEALTH VIRTUAL OBSTETRICS VISIT ENCOUNTER NOTE  Clinic: Center for [facility]  I connected with Mckenzie Nelson on 02/06/20 at  9:00 AM EST by telephone at home and verified that I am speaking with the correct person using two identifiers.   I discussed the limitations, risks, security and privacy concerns of performing an evaluation and management service by telephone and the availability of in person appointments. I also discussed with the patient that there may be a patient responsible charge related to this service. The patient expressed understanding and agreed to proceed.  Subjective:  Mckenzie Nelson is a 29 y.o. G3P2002 at [redacted]w[redacted]d being followed for ongoing prenatal care.  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  Patient reports no complaints. Reports fetal movement. Denies any contractions, bleeding or leaking of fluid.   The following portions of the patient's history were reviewed and updated as appropriate: allergies, current medications, past family history, past medical history, past social history, past surgical history and problem list.   Objective:   Vitals:   02/06/20 0907  BP: 120/81  Pulse: (!) 125    Babyscripts Data Reviewed: not applicable  General:  Alert, oriented and cooperative.   Mental Status: Normal mood and affect perceived. Normal judgment and thought content.  Rest of physical exam deferred due to type of encounter  Assessment and Plan:  Pregnancy: G3P2002 at [redacted]w[redacted]d 1. Preexisting hypertension complicating pregnancy, antepartum Doing well on procardia xl 30 qday and low dose asa  2. Supervision of high risk pregnancy  in second trimester Routine care  3. Sickle cell trait in mother affecting pregnancy ([place]) Surveillance ucx next visit  4. Dichorionic diamniotic twin pregnancy in second trimester Normal growth last week. Has rpt for later this month  Preterm labor symptoms and general obstetric precautions including but not limited to vaginal bleeding, contractions, leaking of fluid and fetal movement were reviewed in detail with the patient.  I discussed the assessment and treatment plan with the patient. The patient was provided an opportunity to ask questions and all were answered. The patient agreed with the plan and demonstrated an understanding of the instructions. The patient was advised to call back or seek an in-person office evaluation/go to MAU at [facility] for any urgent or concerning symptoms. Please refer to After Visit Summary for other counseling recommendations.   I provided 7 minutes of non-face-to-face time during this encounter. The visit was conducted via MyChart-medicine  No follow-ups on file.  Future Appointments  Date Time Provider [place]  02/25/2020  9:15 AM [facility] NURSE [facility]  02/25/2020  9:30 AM [facility] US3 [facility]  03/05/2020  3:15 PM Aletha Halim, MD [facility]    Aletha Halim, MD Center for [facility], [place]

## 2020-02-06 NOTE — Progress Notes (Signed)
I connected with  Mckenzie Nelson on 02/06/20 at  9:00 AM EST by telephone and verified that I am speaking with the correct person using two identifiers.   I discussed the limitations, risks, security and privacy concerns of performing an evaluation and management service by telephone and the availability of in person appointments. I also discussed with the patient that there may be a patient responsible charge related to this service. The patient expressed understanding and agreed to proceed.  [name], RN 02/06/2020  9:09 AM

## 2020-02-25 ENCOUNTER — Other Ambulatory Visit: Payer: Self-pay | Admitting: *Deleted

## 2020-02-25 ENCOUNTER — Ambulatory Visit: Payer: Medicaid Other | Attending: Obstetrics and Gynecology

## 2020-02-25 ENCOUNTER — Encounter: Payer: Self-pay | Admitting: *Deleted

## 2020-02-25 ENCOUNTER — Other Ambulatory Visit: Payer: Self-pay

## 2020-02-25 ENCOUNTER — Ambulatory Visit: Payer: Medicaid Other | Admitting: *Deleted

## 2020-02-25 DIAGNOSIS — O10012 Pre-existing essential hypertension complicating pregnancy, second trimester: Secondary | ICD-10-CM

## 2020-02-25 DIAGNOSIS — Z3A23 23 weeks gestation of pregnancy: Secondary | ICD-10-CM

## 2020-02-25 DIAGNOSIS — Z362 Encounter for other antenatal screening follow-up: Secondary | ICD-10-CM

## 2020-02-25 DIAGNOSIS — O30042 Twin pregnancy, dichorionic/diamniotic, second trimester: Secondary | ICD-10-CM

## 2020-02-25 DIAGNOSIS — O10919 Unspecified pre-existing hypertension complicating pregnancy, unspecified trimester: Secondary | ICD-10-CM

## 2020-03-05 ENCOUNTER — Other Ambulatory Visit: Payer: Self-pay

## 2020-03-05 ENCOUNTER — Telehealth (INDEPENDENT_AMBULATORY_CARE_PROVIDER_SITE_OTHER): Payer: Medicaid Other | Admitting: Obstetrics and Gynecology

## 2020-03-05 VITALS — BP 114/70 | HR 97

## 2020-03-05 DIAGNOSIS — O99012 Anemia complicating pregnancy, second trimester: Secondary | ICD-10-CM

## 2020-03-05 DIAGNOSIS — O10919 Unspecified pre-existing hypertension complicating pregnancy, unspecified trimester: Secondary | ICD-10-CM

## 2020-03-05 DIAGNOSIS — O0992 Supervision of high risk pregnancy, unspecified, second trimester: Secondary | ICD-10-CM

## 2020-03-05 DIAGNOSIS — D573 Sickle-cell trait: Secondary | ICD-10-CM

## 2020-03-05 DIAGNOSIS — O10912 Unspecified pre-existing hypertension complicating pregnancy, second trimester: Secondary | ICD-10-CM

## 2020-03-05 DIAGNOSIS — Z3A24 24 weeks gestation of pregnancy: Secondary | ICD-10-CM

## 2020-03-05 DIAGNOSIS — O30042 Twin pregnancy, dichorionic/diamniotic, second trimester: Secondary | ICD-10-CM

## 2020-03-05 DIAGNOSIS — O99019 Anemia complicating pregnancy, unspecified trimester: Secondary | ICD-10-CM

## 2020-03-05 NOTE — Progress Notes (Signed)
I connected with  Mckenzie Nelson on 03/05/20 at  3:15 PM EST by telephone and verified that I am speaking with the correct person using two identifiers.   I discussed the limitations, risks, security and privacy concerns of performing an evaluation and management service by telephone and the availability of in person appointments. I also discussed with the patient that there may be a patient responsible charge related to this service. The patient expressed understanding and agreed to proceed.  [name], RN 03/05/2020  3:25 PM

## 2020-03-05 NOTE — Progress Notes (Signed)
   TELEHEALTH VIRTUAL OBSTETRICS VISIT ENCOUNTER NOTE  Clinic: Center for [facility]  I connected with Mckenzie Nelson on 03/05/20 at  3:15 PM EST by telephone at home and verified that I am speaking with the correct person using two identifiers.   I discussed the limitations, risks, security and privacy concerns of performing an evaluation and management service by telephone and the availability of in person appointments. I also discussed with the patient that there may be a patient responsible charge related to this service. The patient expressed understanding and agreed to proceed.  Subjective:  Mckenzie Nelson is a 29 y.o. G3P2002 at [redacted]w[redacted]d being followed for ongoing prenatal care.  She is currently monitored for the following issues for this high-risk pregnancy and has Sickle cell trait in mother affecting pregnancy ([place]); Supervision of high-risk pregnancy; Dichorionic diamniotic twin gestation; Preexisting hypertension complicating pregnancy, antepartum; and COVID-19 affecting pregnancy in second trimester on their problem list.  Patient reports no complaints. Reports fetal movement. Denies any contractions, bleeding or leaking of fluid.   The following portions of the patient's history were reviewed and updated as appropriate: allergies, current medications, past family history, past medical history, past social history, past surgical history and problem list.   Objective:   Vitals:   03/05/20 1525  BP: 114/70  Pulse: 97    Babyscripts Data Reviewed: not applicable  General:  Alert, oriented and cooperative.   Mental Status: Normal mood and affect perceived. Normal judgment and thought content.  Rest of physical exam deferred due to type of encounter  Assessment and Plan:  Pregnancy: G3P2002 at [redacted]w[redacted]d 1. Preexisting hypertension complicating pregnancy, antepartum Doing well on procardia xl 30 qday and low dose asa  2. Supervision of high risk pregnancy in  second trimester Routine care. I d/w her that next visits will have to be in person. F/u pt re: contraception  She doesn't want to do a formal GTT and wants to do CBG checks like in last pregnancy. D/w her more nv.   3. Sickle cell trait in mother affecting pregnancy ([place]) Needs surveillance ucx next visit  4. Dichorionic diamniotic twin pregnancy in second trimester Normal u/s x 2 on 11/30. Has rpt for later this month  Preterm labor symptoms and general obstetric precautions including but not limited to vaginal bleeding, contractions, leaking of fluid and fetal movement were reviewed in detail with the patient.  I discussed the assessment and treatment plan with the patient. The patient was provided an opportunity to ask questions and all were answered. The patient agreed with the plan and demonstrated an understanding of the instructions. The patient was advised to call back or seek an in-person office evaluation/go to MAU at [facility] for any urgent or concerning symptoms. Please refer to After Visit Summary for other counseling recommendations.   I provided 7 minutes of non-face-to-face time during this encounter. The visit was conducted via Mychart-medicine  No follow-ups on file.  Future Appointments  Date Time Provider [place]  03/19/2020 11:15 AM Harolyn Rutherford, Sallyanne Havers, MD [facility]  03/24/2020  9:45 AM [facility] NURSE [facility]  03/24/2020 10:00 AM [facility] US1 [facility]  03/31/2020  8:45 AM Donnamae Jude, MD [facility]    Aletha Halim, MD Center for [facility], [place]

## 2020-03-12 ENCOUNTER — Encounter: Payer: Self-pay | Admitting: *Deleted

## 2020-03-19 ENCOUNTER — Encounter: Payer: Self-pay | Admitting: Obstetrics & Gynecology

## 2020-03-19 ENCOUNTER — Other Ambulatory Visit: Payer: Self-pay

## 2020-03-19 ENCOUNTER — Ambulatory Visit (INDEPENDENT_AMBULATORY_CARE_PROVIDER_SITE_OTHER): Payer: Medicaid Other | Admitting: Obstetrics & Gynecology

## 2020-03-19 VITALS — BP 153/97 | HR 137 | Wt 187.0 lb

## 2020-03-19 DIAGNOSIS — O99013 Anemia complicating pregnancy, third trimester: Secondary | ICD-10-CM

## 2020-03-19 DIAGNOSIS — O0992 Supervision of high risk pregnancy, unspecified, second trimester: Secondary | ICD-10-CM

## 2020-03-19 DIAGNOSIS — O10919 Unspecified pre-existing hypertension complicating pregnancy, unspecified trimester: Secondary | ICD-10-CM

## 2020-03-19 DIAGNOSIS — O30042 Twin pregnancy, dichorionic/diamniotic, second trimester: Secondary | ICD-10-CM

## 2020-03-19 MED ORDER — NIFEDIPINE ER OSMOTIC RELEASE 60 MG PO TB24: 60.0000 mg | ORAL_TABLET | Freq: Every day | ORAL | 2 refills | Status: DC

## 2020-03-19 NOTE — Patient Instructions (Signed)
Return to office for any scheduled appointments. Call the office or go to the MAU at [place] at [facility] if:  You begin to have strong, frequent contractions  Your water breaks.  Sometimes it is a big gush of fluid, sometimes it is just a trickle that keeps getting your panties wet or running down your legs  You have vaginal bleeding.  It is normal to have a small amount of spotting if your cervix was checked.   You do not feel your babies moving like normal.  If you do not, get something to eat and drink and lay down and focus on feeling your babies move.   If your babies are still not moving like normal, you should call the office or go to MAU.  Any other obstetric concerns.  TDaP Vaccine Pregnancy Get the Whooping Cough Vaccine While You Are Pregnant (CDC)  It is important for women to get the whooping cough vaccine in the third trimester of each pregnancy. Vaccines are the best way to prevent this disease. There are 2 different whooping cough vaccines. Both vaccines combine protection against whooping cough, tetanus and diphtheria, but they are for different age groups: Tdap: for everyone 11 years or older, including pregnant women  DTaP: for children 2 months through 42 years of age  You need the whooping cough vaccine during each of your pregnancies The recommended time to get the shot is during your 27th through 36th week of pregnancy, preferably during the earlier part of this time period. The Centers for Disease Control and Prevention (CDC) recommends that pregnant women receive the whooping cough vaccine for adolescents and adults (called Tdap vaccine) during the third trimester of each pregnancy. The recommended time to get the shot is during your 27th through 36th week of pregnancy, preferably during the earlier part of this time period. This replaces the original recommendation that pregnant women get the vaccine only if they had not previously received it. The  [employer] of Obstetricians and Gynecologists and the [employer] support this recommendation.  You should get the whooping cough vaccine while pregnant to pass protection to your baby frame support disabled and/or not supported in this browser  Learn why Mickel Baas decided to get the whooping cough vaccine in her 3rd trimester of pregnancy and how her baby girl was born with some protection against the disease. Also available on YouTube. After receiving the whooping cough vaccine, your body will create protective antibodies (proteins produced by the body to fight off diseases) and pass some of them to your baby before birth. These antibodies provide your baby some short-term protection against whooping cough in early life. These antibodies can also protect your baby from some of the more serious complications that come along with whooping cough. Your protective antibodies are at their highest about 2 weeks after getting the vaccine, but it takes time to pass them to your baby. So the preferred time to get the whooping cough vaccine is early in your third trimester. The amount of whooping cough antibodies in your body decreases over time. That is why CDC recommends you get a whooping cough vaccine during each pregnancy. Doing so allows each of your babies to get the greatest number of protective antibodies from you. This means each of your babies will get the best protection possible against this disease.  Getting the whooping cough vaccine while pregnant is better than getting the vaccine after you give birth Whooping cough vaccination during  pregnancy is ideal so your baby will have short-term protection as soon as he is born. This early protection is important because your baby will not start getting his whooping cough vaccines until he is 2 months old. These first few months of life are when your baby is at greatest risk for catching whooping cough. This is also when he's at  greatest risk for having severe, potentially life-threating complications from the infection. To avoid that gap in protection, it is best to get a whooping cough vaccine during pregnancy. You will then pass protection to your baby before he is born. To continue protecting your baby, he should get whooping cough vaccines starting at 2 months old. You may never have gotten the Tdap vaccine before and did not get it during this pregnancy. If so, you should make sure to get the vaccine immediately after you give birth, before leaving the hospital or birthing center. It will take about 2 weeks before your body develops protection (antibodies) in response to the vaccine. Once you have protection from the vaccine, you are less likely to give whooping cough to your newborn while caring for him. But remember, your baby will still be at risk for catching whooping cough from others. A recent study looked to see how effective Tdap was at preventing whooping cough in babies whose mothers got the vaccine while pregnant or in the hospital after giving birth. The study found that getting Tdap between 27 through 36 weeks of pregnancy is 85% more effective at preventing whooping cough in babies younger than 2 months old. Blood tests cannot tell if you need a whooping cough vaccine There are no blood tests that can tell you if you have enough antibodies in your body to protect yourself or your baby against whooping cough. Even if you have been sick with whooping cough in the past or previously received the vaccine, you still should get the vaccine during each pregnancy. Breastfeeding may pass some protective antibodies onto your baby By breastfeeding, you may pass some antibodies you have made in response to the vaccine to your baby. When you get a whooping cough vaccine during your pregnancy, you will have antibodies in your breast milk that you can share with your baby as soon as your milk comes in. However, your baby will not  get protective antibodies immediately if you wait to get the whooping cough vaccine until after delivering your baby. This is because it takes about 2 weeks for your body to create antibodies. Learn more about the health benefits of breastfeeding.  

## 2020-03-19 NOTE — Progress Notes (Signed)
   PRENATAL VISIT NOTE  Subjective:  Mckenzie Nelson is a 29 y.o. G3P2002 at [redacted]w[redacted]d being seen today for ongoing prenatal care.  She is currently monitored for the following issues for this high-risk pregnancy and has Sickle cell trait in mother affecting pregnancy ([place]); Supervision of high-risk pregnancy; Dichorionic diamniotic twin gestation; Preexisting hypertension complicating pregnancy, antepartum; and COVID-19 affecting pregnancy in second trimester on their problem list.  Patient reports no complaints. Patient denies any headaches, visual symptoms, RUQ/epigastric pain or other concerning symptoms.  Contractions: Not present. Vag. Bleeding: None.  Movement: Present. Denies leaking of fluid.   The following portions of the patient's history were reviewed and updated as appropriate: allergies, current medications, past family history, past medical history, past social history, past surgical history and problem list.   Objective:   Vitals:   03/19/20 1141  BP: (!) 153/97  Pulse: (!) 137  Weight: 187 lb (84.8 kg)    Fetal Status: Fetal Heart Rate (bpm): 157/141   Movement: Present     General:  Alert, oriented and cooperative. Patient is in no acute distress.  Skin: Skin is warm and dry. No rash noted.   Cardiovascular: Normal heart rate noted  Respiratory: Normal respiratory effort, no problems with respiration noted  Abdomen: Soft, gravid, appropriate for gestational age.  Pain/Pressure: Absent     Pelvic: Cervical exam deferred        Extremities: Normal range of motion.  Edema: None  Mental Status: Normal mood and affect. Normal behavior. Normal judgment and thought content.   Assessment and Plan:  Pregnancy: G3P2002 at [redacted]w[redacted]d 1. Preexisting hypertension complicating pregnancy, antepartum No symptoms. Will check labs today, will follow up results and manage accordingly. Preeclampsia precautions reviewed with patient. Growth scans and antenatal testing later as per MFM. -  CBC - Comprehensive metabolic panel - Protein / creatinine ratio, urine - NIFEdipine (PROCARDIA XL/NIFEDICAL XL) 60 MG 24 hr tablet; Take 1 tablet (60 mg total) by mouth daily.  Dispense: 30 tablet; Refill: 2  2. Dichorionic diamniotic twin pregnancy in second trimester Next MFM scan is on 03/24/20.  Growth scans and antenatal testing later as per MFM.  3. Supervision of high risk pregnancy in second trimester [place] declines GTT, never did it with other pregnancies. Willing to do fasting BG next visit, will also check blood sugars on her own.  HgA1C will be added to her labs next visit too. Preterm labor symptoms and general obstetric precautions including but not limited to vaginal bleeding, contractions, leaking of fluid and fetal movement were reviewed in detail with the patient. Please refer to After Visit Summary for other counseling recommendations.   Return in about 2 weeks (around 04/02/2020) for Fasting labs but no GTT (CBC, CMET, HIV, RPR, HgA1C),TDap, OFFICE OB VISIT (MD only)**Declines GTT**.  Future Appointments  Date Time Provider [place]  03/24/2020  9:45 AM [facility] NURSE [facility]  03/24/2020 10:00 AM [facility] US1 [facility]  03/31/2020  8:45 AM Donnamae Jude, MD [facility]    Verita Schneiders, MD

## 2020-03-20 LAB — COMPREHENSIVE METABOLIC PANEL
ALT: 6 IU/L (ref 0–32)
AST: 9 IU/L (ref 0–40)
Albumin/Globulin Ratio: 1.1 — ABNORMAL LOW (ref 1.2–2.2)
Albumin: 3.5 g/dL — ABNORMAL LOW (ref 3.9–5.0)
Alkaline Phosphatase: 123 IU/L — ABNORMAL HIGH (ref 44–121)
BUN/Creatinine Ratio: 11 (ref 9–23)
BUN: 4 mg/dL — ABNORMAL LOW (ref 6–20)
Bilirubin Total: 0.3 mg/dL (ref 0.0–1.2)
CO2: 17 mmol/L — ABNORMAL LOW (ref 20–29)
Calcium: 8.8 mg/dL (ref 8.7–10.2)
Chloride: 101 mmol/L (ref 96–106)
Creatinine, Ser: 0.37 mg/dL — ABNORMAL LOW (ref 0.57–1.00)
GFR calc Af Amer: 167 mL/min/{1.73_m2} (ref >59)
GFR calc non Af Amer: 145 mL/min/{1.73_m2} (ref >59)
Globulin, Total: 3.3 g/dL (ref 1.5–4.5)
Glucose: 69 mg/dL (ref 65–99)
Potassium: 3.6 mmol/L (ref 3.5–5.2)
Sodium: 134 mmol/L (ref 134–144)
Total Protein: 6.8 g/dL (ref 6.0–8.5)

## 2020-03-20 LAB — PROTEIN / CREATININE RATIO, URINE
Creatinine, Urine: 8.7 mg/dL
Protein, Ur: <4 mg/dL

## 2020-03-20 LAB — CBC
Hematocrit: 29.9 % — ABNORMAL LOW (ref 34.0–46.6)
Hemoglobin: 10.1 g/dL — ABNORMAL LOW (ref 11.1–15.9)
MCH: 26.8 pg (ref 26.6–33.0)
MCHC: 33.8 g/dL (ref 31.5–35.7)
MCV: 79 fL (ref 79–97)
Platelets: 271 10*3/uL (ref 150–450)
RBC: 3.77 x10E6/uL (ref 3.77–5.28)
RDW: 15.2 % (ref 11.7–15.4)
WBC: 7.1 10*3/uL (ref 3.4–10.8)

## 2020-03-23 MED ORDER — FERROUS SULFATE 325 (65 FE) MG PO TABS: 325.0000 mg | ORAL_TABLET | ORAL | 3 refills | Status: DC

## 2020-03-23 NOTE — Addendum Note (Signed)
Addended by: Jaynie Collins A on: 03/23/2020 02:43 PM   Modules accepted: Orders

## 2020-03-24 ENCOUNTER — Other Ambulatory Visit: Payer: Self-pay | Admitting: *Deleted

## 2020-03-24 ENCOUNTER — Encounter: Payer: Self-pay | Admitting: *Deleted

## 2020-03-24 ENCOUNTER — Ambulatory Visit: Payer: Medicaid Other | Attending: Obstetrics and Gynecology

## 2020-03-24 ENCOUNTER — Ambulatory Visit: Payer: Medicaid Other | Admitting: *Deleted

## 2020-03-24 ENCOUNTER — Other Ambulatory Visit: Payer: Self-pay | Admitting: Obstetrics and Gynecology

## 2020-03-24 ENCOUNTER — Other Ambulatory Visit: Payer: Self-pay

## 2020-03-24 DIAGNOSIS — O30042 Twin pregnancy, dichorionic/diamniotic, second trimester: Secondary | ICD-10-CM

## 2020-03-24 DIAGNOSIS — Z862 Personal history of diseases of the blood and blood-forming organs and certain disorders involving the immune mechanism: Secondary | ICD-10-CM

## 2020-03-24 DIAGNOSIS — Z362 Encounter for other antenatal screening follow-up: Secondary | ICD-10-CM

## 2020-03-24 DIAGNOSIS — O10919 Unspecified pre-existing hypertension complicating pregnancy, unspecified trimester: Secondary | ICD-10-CM | POA: Diagnosis present

## 2020-03-24 DIAGNOSIS — O10012 Pre-existing essential hypertension complicating pregnancy, second trimester: Secondary | ICD-10-CM

## 2020-03-24 DIAGNOSIS — Z3A27 27 weeks gestation of pregnancy: Secondary | ICD-10-CM

## 2020-03-31 ENCOUNTER — Encounter: Payer: Medicaid Other | Admitting: Family Medicine

## 2020-04-07 ENCOUNTER — Other Ambulatory Visit: Payer: Self-pay

## 2020-04-07 ENCOUNTER — Telehealth (INDEPENDENT_AMBULATORY_CARE_PROVIDER_SITE_OTHER): Payer: Medicaid Other | Admitting: Obstetrics & Gynecology

## 2020-04-07 VITALS — BP 119/81 | HR 109

## 2020-04-07 DIAGNOSIS — O30043 Twin pregnancy, dichorionic/diamniotic, third trimester: Secondary | ICD-10-CM

## 2020-04-07 DIAGNOSIS — O0992 Supervision of high risk pregnancy, unspecified, second trimester: Secondary | ICD-10-CM

## 2020-04-07 DIAGNOSIS — O10919 Unspecified pre-existing hypertension complicating pregnancy, unspecified trimester: Secondary | ICD-10-CM

## 2020-04-07 DIAGNOSIS — O30042 Twin pregnancy, dichorionic/diamniotic, second trimester: Secondary | ICD-10-CM

## 2020-04-07 DIAGNOSIS — O10913 Unspecified pre-existing hypertension complicating pregnancy, third trimester: Secondary | ICD-10-CM

## 2020-04-07 DIAGNOSIS — Z3A29 29 weeks gestation of pregnancy: Secondary | ICD-10-CM

## 2020-04-07 NOTE — Progress Notes (Signed)
OBSTETRICS PRENATAL VIRTUAL VISIT ENCOUNTER NOTE  Provider location: [facility] [place] [facility]   I connected with [name] on 04/07/20 at 10:45 AM EST by MyChart Video Encounter at home and verified that I am speaking with the correct person using two identifiers.   I discussed the limitations, risks, security and privacy concerns of performing an evaluation and management service virtually and the availability of in person appointments. I also discussed with the patient that there may be a patient responsible charge related to this service. The patient expressed understanding and agreed to proceed. Subjective:  Mckenzie Nelson is a 30 y.o. G3P2002 at [redacted]w[redacted]d being seen today for ongoing prenatal care.  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  Patient reports no complaints.  Contractions: Not present. Vag. Bleeding: None.  Movement: Present. Denies any leaking of fluid.   The following portions of the patient's history were reviewed and updated as appropriate: allergies, current medications, past family history, past medical history, past social history, past surgical history and problem list.   Objective:   Vitals:   04/07/20 1026  BP: 119/81  Pulse: (!) 109    Fetal Status:     Movement: Present     General:  Alert, oriented and cooperative. Patient is in no acute distress.  Respiratory: Normal respiratory effort, no problems with respiration noted  Mental Status: Normal mood and affect. Normal behavior. Normal judgment and thought content.  Rest of physical exam deferred due to type of  encounter  Imaging: [place] MFM OB FOLLOW UP  Result Date: 03/24/2020 ----------------------------------------------------------------------  OBSTETRICS REPORT                       (Signed Final 03/24/2020 12:03 pm) ---------------------------------------------------------------------- Patient Info  ID #:       [id number]                          D.O.B.:  02-Oct-1990 (29 yrs)  Name:       Mckenzie Nelson                  Visit Date: 03/24/2020 11:01 am ---------------------------------------------------------------------- Performed By  Attending:        Tama High MD        Ref. Address:     [place]  Performed By:     Rodrigo Ran BS      [facility]  Referred By:      [place] ---------------------------------------------------------------------- Orders  #  Description                           Code        Ordered By  1  [place] MFM OB FOLLOW UP                   [phone]    YU FANG  2  [place] MFM OB FOLLOW UP ADDL              [id number]    Peterson Ao     GEST ----------------------------------------------------------------------  #  Order #                     Accession #                Episode #  1  [id number]                   [id number]                 [id number]  2  [id number]                   [id number]                 [id number] ---------------------------------------------------------------------- Indications  Encounter for other antenatal screening        Z36.2  follow-up  Twin pregnancy, di/di, second trimester        O30.042  Hypertension - Chronic/Pre-existing            O10.019  (Procardia)  History of sickle cell trait                   Z86.2  [redacted] weeks gestation of pregnancy                Z3A.27  ---------------------------------------------------------------------- Fetal Evaluation (Fetus A)  Num Of Fetuses:         2  Fetal Heart Rate(bpm):  133  Cardiac Activity:       Observed  Fetal Lie:              Lower Fetus  Presentation:           Cephalic  Placenta:               Anterior  P. Cord Insertion:      Visualized, central  Membrane Desc:      Dividing Membrane seen - Dichorionic.  Amniotic Fluid  AFI FV:      Within normal limits                              Largest Pocket(cm)                              4.7 ---------------------------------------------------------------------- Biometry (Fetus A)  BPD:      76.8  mm  G. Age:  30w 6d       > 99  %    CI:        77.08   %    70 - 86                                                          FL/HC:      18.2   %    18.6 - 20.4  HC:       277   mm     G. Age:  30w 2d         96  %    HC/AC:      1.10        1.05 - 1.21  AC:      250.7  mm     G. Age:  29w 2d         92  %    FL/BPD:     65.5   %    71 - 87  FL:       50.3  mm     G. Age:  27w 0d         27  %    FL/AC:      20.1   %    20 - 24  HUM:      44.8  mm     G. Age:  26w 4d         30  %  LV:          3  mm  Est. FW:    1276  gm    2 lb 13 oz      90  %     FW Discordancy     0 \ 14 % ---------------------------------------------------------------------- OB History  Gravidity:    3         Term:   2        Prem:   0        SAB:   0  TOP:          0       Ectopic:  0        Living: 2 ---------------------------------------------------------------------- Gestational Age (Fetus A)  LMP:           34w 3d        Date:  07/27/19                 EDD:   05/02/20  U/S Today:     29w 3d                                        EDD:   06/06/20  Best:          27w 2d     Det. ByLoman Chroman         EDD:   06/21/20                                      (11/05/19) ---------------------------------------------------------------------- Anatomy (Fetus A)  Cranium:  Appears normal         LVOT:                    Appears normal  Cavum:                 Appears normal         Aortic Arch:            Appears normal  Ventricles:            Appears normal         Ductal Arch:            Previously seen  Choroid Plexus:        Resolved CPC           Diaphragm:              Previously seen  Cerebellum:            Previously seen        Stomach:                Appears normal, left                                                                        sided  Posterior Fossa:       Previously seen        Abdomen:                Appears normal  Nuchal Fold:           Previously seen        Abdominal Wall:         Previously seen  Face:                  Appears normal         Cord Vessels:           Previously seen                         (orbits and profile)  Lips:                  Previously seen        Kidneys:                Appear normal  Palate:                Appears normal         Bladder:                Appears normal  Thoracic:              Appears normal         Spine:                  Previously seen  Heart:                 Appears normal         Upper Extremities:      Previously seen                         (  4CH, axis, and                         situs)  RVOT:                  Appears normal         Lower Extremities:      Previously seen  Other:  Parents do not wish to know sex of fetus.Heels/feet and open          hands/5th digits previously visualized. Nasal bone visualized. VC,          3VV and 3VTV previously visualized. ---------------------------------------------------------------------- Fetal Evaluation (Fetus B)  Num Of Fetuses:         2  Fetal Heart Rate(bpm):  143  Cardiac Activity:       Observed  Fetal Lie:              Upper Fetus  Presentation:           Transverse, head to maternal right  Placenta:               Anterior  P. Cord Insertion:      Previously Visualized  Membrane Desc:      Dividing Membrane seen - Dichorionic.  Amniotic Fluid  AFI FV:      Within normal limits                               Largest Pocket(cm)                              7.2 ---------------------------------------------------------------------- Biometry (Fetus B)  BPD:        70  mm     G. Age:  28w 1d         67  %    CI:        73.17   %    70 - 86                                                          FL/HC:      18.9   %    18.6 - 20.4  HC:      260.1  mm     G. Age:  28w 2d         54  %    HC/AC:      1.09        1.05 - 1.21  AC:      238.6  mm     G. Age:  28w 1d         69  %    FL/BPD:     70.1   %    71 - 87  FL:       49.1  mm     G. Age:  26w 4d         16  %    FL/AC:      20.6   %    20 - 24  HUM:      46.3  mm     G. Age:  27w 2d  47  %  LV:        2.9  mm  Est. FW:    1101  gm      2 lb 7 oz     51  %     FW Discordancy        14  % ---------------------------------------------------------------------- Gestational Age (Fetus B)  LMP:           34w 3d        Date:  07/27/19                 EDD:   05/02/20  U/S Today:     27w 6d                                        EDD:   06/17/20  Best:          27w 2d     Det. ByLoman Chroman         EDD:   06/21/20                                      (11/05/19) ---------------------------------------------------------------------- Anatomy (Fetus B)  Cranium:               Appears normal         LVOT:                   Previously seen  Cavum:                 Previously seen        Aortic Arch:            Previously seen  Ventricles:            Appears normal         Ductal Arch:            Previously seen  Choroid Plexus:        Appears normal         Diaphragm:              Previously seen  Cerebellum:            Previously seen        Stomach:                Appears normal, left                                                                        sided  Posterior Fossa:       Previously seen        Abdomen:                Appears normal  Nuchal Fold:           Previously seen        Abdominal Wall:         Appears nml (cord  insert, abd wall)  Face:                  Profile nl; orbits     Cord Vessels:           Previously seen                         prev visualized  Lips:                  Appears normal         Kidneys:                Appear normal  Palate:                Not well visualized    Bladder:                Appears normal  Thoracic:              Appears normal         Spine:                  Previously seen  Heart:                 Previously seen        Upper Extremities:      Previously seen  RVOT:                  Previously seen        Lower Extremities:      Previously seen  Other:  Parents do not wish to know sex of fetus. Heels/feet and open          hands/5th digits previously visualized. Nasal bone visualized. VC,          3VV previously visualized. ---------------------------------------------------------------------- Cervix Uterus Adnexa  Cervix  Not visualized (advanced GA >24wks)  Uterus  No abnormality visualized.  Right Ovary  Not visualized.  Left Ovary  Not visualized.  Cul De Sac  No free fluid seen.  Adnexa  No abnormality visualized. ---------------------------------------------------------------------- Impression  Dichorionic-diamniotic twin pregnancy.  Patient return for fetal  growth assessment.  Twin A: Lower fetus, cephalic presentation, anterior placenta.  Amniotic fluid is normal and good fetal activity seen.  Fetal  growth is appropriate for gestational age.  (Estimated fetal  weight is at the 90th percentile).  Twin B: Upper fetus, transverse lie and head to maternal  right, anterior placenta.Amniotic fluid is normal and good fetal  activity seen.  Fetal growth is appropriate for gestational age.  Growth discordancy: 14% (normal).  Patient has chronic hypertension and takes Procardia.  Blood  pressure today at her office is 148/75 mmHg. ---------------------------------------------------------------------- Recommendations  -An appointment was made for  her to return in 4 weeks for  fetal growth assessment.  -Weekly BPP from 32 weeks till delivery. ----------------------------------------------------------------------                  Tama High, MD Electronically Signed Final Report   03/24/2020 12:03 pm ----------------------------------------------------------------------  [place] MFM OB FOLLOW UP ADDL GEST  Result Date: 03/24/2020 ----------------------------------------------------------------------  OBSTETRICS REPORT                       (Signed Final 03/24/2020 12:03 pm) ---------------------------------------------------------------------- Patient Info  ID #:       [id number]  D.O.B.:  April 22, 1990 (29 yrs)  Name:       Mckenzie Nelson                  Visit Date: 03/24/2020 11:01 am ---------------------------------------------------------------------- Performed By  Attending:        Tama High MD        Ref. Address:     [place]  Performed By:     Rodrigo Ran BS      [facility]                                 [facility] at                                                             [place] [facility]                                                             [facility]  Referred By:      [facility] ---------------------------------------------------------------------- Orders  #  Description                           Code        Ordered By  1  [place] MFM OB FOLLOW UP                   [phone]    YU FANG  2  [place] MFM OB FOLLOW UP ADDL              [id number]    [place] ----------------------------------------------------------------------  #  Order #                     Accession #                Episode #  1  [id number]                   [id number]                 [id number]  2  [id number]                   [id number]                 [id number]  ---------------------------------------------------------------------- Indications  Encounter for other antenatal screening        Z36.2  follow-up  Twin pregnancy,  di/di, second trimester        O30.042  Hypertension - Chronic/Pre-existing            O10.019  (Procardia)  History of sickle cell trait                   Z86.2  [redacted] weeks gestation of pregnancy                Z3A.27 ---------------------------------------------------------------------- Fetal Evaluation (Fetus A)  Num Of Fetuses:         2  Fetal Heart Rate(bpm):  133  Cardiac Activity:       Observed  Fetal Lie:              Lower Fetus  Presentation:           Cephalic  Placenta:               Anterior  P. Cord Insertion:      Visualized, central  Membrane Desc:      Dividing Membrane seen - Dichorionic.  Amniotic Fluid  AFI FV:      Within normal limits                              Largest Pocket(cm)                              4.7 ---------------------------------------------------------------------- Biometry (Fetus A)  BPD:      76.8  mm     G. Age:  30w 6d       > 99  %    CI:        77.08   %    70 - 86                                                          FL/HC:      18.2   %    18.6 - 20.4  HC:       277   mm     G. Age:  30w 2d         96  %    HC/AC:      1.10        1.05 - 1.21  AC:      250.7  mm     G. Age:  29w 2d         92  %    FL/BPD:     65.5   %    71 - 87  FL:       50.3  mm     G. Age:  27w 0d         27  %    FL/AC:      20.1   %    20 - 24  HUM:      44.8  mm     G. Age:  26w 4d         30  %  LV:          3  mm  Est. FW:    1276  gm    2 lb 13 oz      90  %  FW Discordancy     0 \ 14 % ---------------------------------------------------------------------- OB History  Gravidity:    3         Term:   2        Prem:   0        SAB:   0  TOP:          0       Ectopic:  0        Living: 2 ---------------------------------------------------------------------- Gestational Age (Fetus A)  LMP:           34w 3d        Date:   07/27/19                 EDD:   05/02/20  U/S Today:     29w 3d                                        EDD:   06/06/20  Best:          27w 2d     Det. ByLoman Chroman         EDD:   06/21/20                                      (11/05/19) ---------------------------------------------------------------------- Anatomy (Fetus A)  Cranium:               Appears normal         LVOT:                   Appears normal  Cavum:                 Appears normal         Aortic Arch:            Appears normal  Ventricles:            Appears normal         Ductal Arch:            Previously seen  Choroid Plexus:        Resolved CPC           Diaphragm:              Previously seen  Cerebellum:            Previously seen        Stomach:                Appears normal, left                                                                        sided  Posterior Fossa:       Previously seen        Abdomen:                Appears normal  Nuchal Fold:           Previously seen        Abdominal Wall:  Previously seen  Face:                  Appears normal         Cord Vessels:           Previously seen                         (orbits and profile)  Lips:                  Previously seen        Kidneys:                Appear normal  Palate:                Appears normal         Bladder:                Appears normal  Thoracic:              Appears normal         Spine:                  Previously seen  Heart:                 Appears normal         Upper Extremities:      Previously seen                         (4CH, axis, and                         situs)  RVOT:                  Appears normal         Lower Extremities:      Previously seen  Other:  Parents do not wish to know sex of fetus.Heels/feet and open          hands/5th digits previously visualized. Nasal bone visualized. VC,          3VV and 3VTV previously visualized. ---------------------------------------------------------------------- Fetal Evaluation (Fetus B)   Num Of Fetuses:         2  Fetal Heart Rate(bpm):  143  Cardiac Activity:       Observed  Fetal Lie:              Upper Fetus  Presentation:           Transverse, head to maternal right  Placenta:               Anterior  P. Cord Insertion:      Previously Visualized  Membrane Desc:      Dividing Membrane seen - Dichorionic.  Amniotic Fluid  AFI FV:      Within normal limits                              Largest Pocket(cm)                              7.2 ---------------------------------------------------------------------- Biometry (Fetus B)  BPD:        70  mm     G. Age:  28w 1d         67  %  CI:        73.17   %    70 - 86                                                          FL/HC:      18.9   %    18.6 - 20.4  HC:      260.1  mm     G. Age:  28w 2d         54  %    HC/AC:      1.09        1.05 - 1.21  AC:      238.6  mm     G. Age:  28w 1d         69  %    FL/BPD:     70.1   %    71 - 87  FL:       49.1  mm     G. Age:  26w 4d         16  %    FL/AC:      20.6   %    20 - 24  HUM:      46.3  mm     G. Age:  27w 2d         47  %  LV:        2.9  mm  Est. FW:    1101  gm      2 lb 7 oz     51  %     FW Discordancy        14  % ---------------------------------------------------------------------- Gestational Age (Fetus B)  LMP:           34w 3d        Date:  07/27/19                 EDD:   05/02/20  U/S Today:     27w 6d                                        EDD:   06/17/20  Best:          27w 2d     Det. By:  Loman Chroman         EDD:   06/21/20                                      (11/05/19) ---------------------------------------------------------------------- Anatomy (Fetus B)  Cranium:               Appears normal         LVOT:                   Previously seen  Cavum:                 Previously seen        Aortic Arch:            Previously seen  Ventricles:  Appears normal         Ductal Arch:            Previously seen  Choroid Plexus:        Appears normal         Diaphragm:               Previously seen  Cerebellum:            Previously seen        Stomach:                Appears normal, left                                                                        sided  Posterior Fossa:       Previously seen        Abdomen:                Appears normal  Nuchal Fold:           Previously seen        Abdominal Wall:         Appears nml (cord                                                                        insert, abd wall)  Face:                  Profile nl; orbits     Cord Vessels:           Previously seen                         prev visualized  Lips:                  Appears normal         Kidneys:                Appear normal  Palate:                Not well visualized    Bladder:                Appears normal  Thoracic:              Appears normal         Spine:                  Previously seen  Heart:                 Previously seen        Upper Extremities:      Previously seen  RVOT:                  Previously seen        Lower Extremities:      Previously seen  Other:  Parents do not wish to  know sex of fetus. Heels/feet and open          hands/5th digits previously visualized. Nasal bone visualized. VC,          3VV previously visualized. ---------------------------------------------------------------------- Cervix Uterus Adnexa  Cervix  Not visualized (advanced GA >24wks)  Uterus  No abnormality visualized.  Right Ovary  Not visualized.  Left Ovary  Not visualized.  Cul De Sac  No free fluid seen.  Adnexa  No abnormality visualized. ---------------------------------------------------------------------- Impression  Dichorionic-diamniotic twin pregnancy.  Patient return for fetal  growth assessment.  Twin A: Lower fetus, cephalic presentation, anterior placenta.  Amniotic fluid is normal and good fetal activity seen.  Fetal  growth is appropriate for gestational age.  (Estimated fetal  weight is at the 90th percentile).  Twin B: Upper fetus, transverse lie and head to maternal   right, anterior placenta.Amniotic fluid is normal and good fetal  activity seen.  Fetal growth is appropriate for gestational age.  Growth discordancy: 14% (normal).  Patient has chronic hypertension and takes Procardia.  Blood  pressure today at her office is 148/75 mmHg. ---------------------------------------------------------------------- Recommendations  -An appointment was made for her to return in 4 weeks for  fetal growth assessment.  -Weekly BPP from 32 weeks till delivery. ----------------------------------------------------------------------                  Tama High, MD Electronically Signed Final Report   03/24/2020 12:03 pm ----------------------------------------------------------------------   Assessment and Plan:  Pregnancy: [id number] at [redacted]w[redacted]d 1. Preexisting hypertension complicating pregnancy, antepartum Stable BP today. Continue Nifedipine XR 60 mg daily.  Will start antenatal testing weekly soon as per MFM.  2. Dichorionic diamniotic twin pregnancy in second trimester Concordant growth, scans and testing as per MFM  3. [redacted] weeks gestation of pregnancy 4. Supervision of high risk pregnancy in second trimester Stilll declines GTT, never did it with other pregnancies. Willing to do fasting BG next visit, will also check blood sugars on her own.  HgA1C will be added to her labs next visit too. She will be fasting for next visit next week, will check CMET, ferritin, CBC, HIV, RPR, HgA1C) - NO GTT!!!!  Preterm labor symptoms and general obstetric precautions including but not limited to vaginal bleeding, contractions, leaking of fluid and fetal movement were reviewed in detail with the patient. I discussed the assessment and treatment plan with the patient. The patient was provided an opportunity to ask questions and all were answered. The patient agreed with the plan and demonstrated an understanding of the instructions. The patient was advised to call back or seek an in-person office  evaluation/go to [facility] at [facility] for any urgent or concerning symptoms. Please refer to After Visit Summary for other counseling recommendations.   I provided 8 minutes of face-to-face time during this encounter.  No follow-ups on file.  Future Appointments  Date Time Provider [place]  04/07/2020 10:45 AM Osborne Oman, MD [facility]  04/21/2020 12:30 PM [facility] NURSE [facility]  04/21/2020 12:45 PM [facility] US5 [facility]  04/28/2020 11:15 AM [facility] NURSE [facility]  04/28/2020 11:30 AM [facility] US3 [facility]  05/05/2020 11:15 AM [facility] NURSE [facility]  05/05/2020 11:30 AM [facility] US3 [facility] [place]    Verita Schneiders, MD [facility], [place]

## 2020-04-13 ENCOUNTER — Encounter: Payer: Self-pay | Admitting: *Deleted

## 2020-04-14 ENCOUNTER — Other Ambulatory Visit: Payer: Medicaid Other

## 2020-04-16 ENCOUNTER — Other Ambulatory Visit: Payer: Medicaid Other

## 2020-04-21 ENCOUNTER — Encounter: Payer: Medicaid Other | Admitting: Obstetrics and Gynecology

## 2020-04-21 ENCOUNTER — Ambulatory Visit: Payer: Medicaid Other | Admitting: *Deleted

## 2020-04-21 ENCOUNTER — Ambulatory Visit: Payer: Medicaid Other | Attending: Obstetrics and Gynecology

## 2020-04-21 ENCOUNTER — Other Ambulatory Visit: Payer: Self-pay

## 2020-04-21 ENCOUNTER — Other Ambulatory Visit: Payer: Medicaid Other

## 2020-04-21 ENCOUNTER — Encounter: Payer: Self-pay | Admitting: *Deleted

## 2020-04-21 ENCOUNTER — Telehealth: Payer: Self-pay

## 2020-04-21 DIAGNOSIS — O30042 Twin pregnancy, dichorionic/diamniotic, second trimester: Secondary | ICD-10-CM | POA: Diagnosis present

## 2020-04-21 DIAGNOSIS — O30043 Twin pregnancy, dichorionic/diamniotic, third trimester: Secondary | ICD-10-CM

## 2020-04-21 DIAGNOSIS — O322XX1 Maternal care for transverse and oblique lie, fetus 1: Secondary | ICD-10-CM

## 2020-04-21 DIAGNOSIS — O99013 Anemia complicating pregnancy, third trimester: Secondary | ICD-10-CM | POA: Diagnosis not present

## 2020-04-21 DIAGNOSIS — O10919 Unspecified pre-existing hypertension complicating pregnancy, unspecified trimester: Secondary | ICD-10-CM | POA: Diagnosis present

## 2020-04-21 DIAGNOSIS — O322XX2 Maternal care for transverse and oblique lie, fetus 2: Secondary | ICD-10-CM | POA: Diagnosis not present

## 2020-04-21 DIAGNOSIS — O10913 Unspecified pre-existing hypertension complicating pregnancy, third trimester: Secondary | ICD-10-CM | POA: Diagnosis not present

## 2020-04-21 DIAGNOSIS — Z3A31 31 weeks gestation of pregnancy: Secondary | ICD-10-CM

## 2020-04-21 DIAGNOSIS — D573 Sickle-cell trait: Secondary | ICD-10-CM

## 2020-04-21 DIAGNOSIS — Z362 Encounter for other antenatal screening follow-up: Secondary | ICD-10-CM

## 2020-04-21 NOTE — Progress Notes (Signed)
Manual pulse 128

## 2020-04-21 NOTE — Telephone Encounter (Signed)
Called pt to reschedule OB appt that was missed today. Pt did not answer, left vm to call the office back. My chart message sent.

## 2020-04-22 ENCOUNTER — Other Ambulatory Visit: Payer: Medicaid Other

## 2020-04-28 ENCOUNTER — Other Ambulatory Visit: Payer: Self-pay

## 2020-04-28 ENCOUNTER — Ambulatory Visit: Payer: Medicaid Other | Attending: Obstetrics and Gynecology

## 2020-04-28 ENCOUNTER — Other Ambulatory Visit: Payer: Self-pay | Admitting: *Deleted

## 2020-04-28 ENCOUNTER — Ambulatory Visit: Payer: Medicaid Other | Admitting: *Deleted

## 2020-04-28 ENCOUNTER — Encounter: Payer: Self-pay | Admitting: *Deleted

## 2020-04-28 DIAGNOSIS — Z362 Encounter for other antenatal screening follow-up: Secondary | ICD-10-CM | POA: Diagnosis not present

## 2020-04-28 DIAGNOSIS — Z862 Personal history of diseases of the blood and blood-forming organs and certain disorders involving the immune mechanism: Secondary | ICD-10-CM

## 2020-04-28 DIAGNOSIS — O10919 Unspecified pre-existing hypertension complicating pregnancy, unspecified trimester: Secondary | ICD-10-CM

## 2020-04-28 DIAGNOSIS — O30042 Twin pregnancy, dichorionic/diamniotic, second trimester: Secondary | ICD-10-CM | POA: Diagnosis present

## 2020-04-28 DIAGNOSIS — O10013 Pre-existing essential hypertension complicating pregnancy, third trimester: Secondary | ICD-10-CM | POA: Diagnosis not present

## 2020-04-28 DIAGNOSIS — Z3A32 32 weeks gestation of pregnancy: Secondary | ICD-10-CM

## 2020-04-28 DIAGNOSIS — O30043 Twin pregnancy, dichorionic/diamniotic, third trimester: Secondary | ICD-10-CM | POA: Diagnosis not present

## 2020-05-01 ENCOUNTER — Other Ambulatory Visit: Payer: Self-pay

## 2020-05-01 ENCOUNTER — Other Ambulatory Visit: Payer: Medicaid Other

## 2020-05-01 DIAGNOSIS — O99013 Anemia complicating pregnancy, third trimester: Secondary | ICD-10-CM

## 2020-05-01 DIAGNOSIS — O0992 Supervision of high risk pregnancy, unspecified, second trimester: Secondary | ICD-10-CM

## 2020-05-04 LAB — CBC
Hematocrit: 27.4 % — ABNORMAL LOW (ref 34.0–46.6)
Hemoglobin: 9.3 g/dL — ABNORMAL LOW (ref 11.1–15.9)
MCH: 25.5 pg — ABNORMAL LOW (ref 26.6–33.0)
MCHC: 33.9 g/dL (ref 31.5–35.7)
MCV: 75 fL — ABNORMAL LOW (ref 79–97)
Platelets: 241 10*3/uL (ref 150–450)
RBC: 3.65 x10E6/uL — ABNORMAL LOW (ref 3.77–5.28)
RDW: 17.2 % — ABNORMAL HIGH (ref 11.7–15.4)
WBC: 7.4 10*3/uL (ref 3.4–10.8)

## 2020-05-04 LAB — COMPREHENSIVE METABOLIC PANEL
ALT: 6 IU/L (ref 0–32)
AST: 11 IU/L (ref 0–40)
Albumin/Globulin Ratio: 1 — ABNORMAL LOW (ref 1.2–2.2)
Albumin: 3.3 g/dL — ABNORMAL LOW (ref 3.9–5.0)
Alkaline Phosphatase: 194 IU/L — ABNORMAL HIGH (ref 44–121)
BUN/Creatinine Ratio: 8 — ABNORMAL LOW (ref 9–23)
BUN: 4 mg/dL — ABNORMAL LOW (ref 6–20)
Bilirubin Total: 0.3 mg/dL (ref 0.0–1.2)
CO2: 17 mmol/L — ABNORMAL LOW (ref 20–29)
Calcium: 8.6 mg/dL — ABNORMAL LOW (ref 8.7–10.2)
Chloride: 104 mmol/L (ref 96–106)
Creatinine, Ser: 0.52 mg/dL — ABNORMAL LOW (ref 0.57–1.00)
GFR calc Af Amer: 149 mL/min/{1.73_m2} (ref >59)
GFR calc non Af Amer: 130 mL/min/{1.73_m2} (ref >59)
Globulin, Total: 3.3 g/dL (ref 1.5–4.5)
Glucose: 79 mg/dL (ref 65–99)
Potassium: 3.6 mmol/L (ref 3.5–5.2)
Sodium: 137 mmol/L (ref 134–144)
Total Protein: 6.6 g/dL (ref 6.0–8.5)

## 2020-05-04 LAB — FERRITIN: Ferritin: 12 ng/mL — ABNORMAL LOW (ref 15–150)

## 2020-05-04 LAB — HEMOGLOBIN A1C
Est. average glucose Bld gHb Est-mCnc: 91 mg/dL
Hgb A1c MFr Bld: 4.8 % (ref 4.8–5.6)

## 2020-05-04 LAB — HIV ANTIBODY (ROUTINE TESTING W REFLEX): HIV Screen 4th Generation wRfx: NONREACTIVE

## 2020-05-04 LAB — RPR: RPR Ser Ql: NONREACTIVE

## 2020-05-05 ENCOUNTER — Encounter: Payer: Self-pay | Admitting: Obstetrics & Gynecology

## 2020-05-05 ENCOUNTER — Ambulatory Visit: Payer: Medicaid Other | Attending: Obstetrics and Gynecology

## 2020-05-05 ENCOUNTER — Ambulatory Visit: Payer: Medicaid Other | Admitting: *Deleted

## 2020-05-05 ENCOUNTER — Encounter: Payer: Self-pay | Admitting: *Deleted

## 2020-05-05 ENCOUNTER — Other Ambulatory Visit: Payer: Self-pay

## 2020-05-05 ENCOUNTER — Ambulatory Visit (INDEPENDENT_AMBULATORY_CARE_PROVIDER_SITE_OTHER): Payer: Medicaid Other | Admitting: Obstetrics & Gynecology

## 2020-05-05 VITALS — BP 139/84 | HR 116

## 2020-05-05 DIAGNOSIS — Z362 Encounter for other antenatal screening follow-up: Secondary | ICD-10-CM

## 2020-05-05 DIAGNOSIS — Z3A33 33 weeks gestation of pregnancy: Secondary | ICD-10-CM

## 2020-05-05 DIAGNOSIS — O30042 Twin pregnancy, dichorionic/diamniotic, second trimester: Secondary | ICD-10-CM | POA: Insufficient documentation

## 2020-05-05 DIAGNOSIS — O10013 Pre-existing essential hypertension complicating pregnancy, third trimester: Secondary | ICD-10-CM

## 2020-05-05 DIAGNOSIS — D509 Iron deficiency anemia, unspecified: Secondary | ICD-10-CM

## 2020-05-05 DIAGNOSIS — O30043 Twin pregnancy, dichorionic/diamniotic, third trimester: Secondary | ICD-10-CM

## 2020-05-05 DIAGNOSIS — O322XX2 Maternal care for transverse and oblique lie, fetus 2: Secondary | ICD-10-CM

## 2020-05-05 DIAGNOSIS — Z862 Personal history of diseases of the blood and blood-forming organs and certain disorders involving the immune mechanism: Secondary | ICD-10-CM

## 2020-05-05 DIAGNOSIS — O0993 Supervision of high risk pregnancy, unspecified, third trimester: Secondary | ICD-10-CM

## 2020-05-05 DIAGNOSIS — O10919 Unspecified pre-existing hypertension complicating pregnancy, unspecified trimester: Secondary | ICD-10-CM | POA: Diagnosis present

## 2020-05-05 DIAGNOSIS — O99013 Anemia complicating pregnancy, third trimester: Secondary | ICD-10-CM

## 2020-05-05 DIAGNOSIS — O321XX1 Maternal care for breech presentation, fetus 1: Secondary | ICD-10-CM

## 2020-05-05 NOTE — Progress Notes (Signed)
PRENATAL VISIT NOTE  Subjective:  Mckenzie Nelson is a 30 y.o. G3P2002 at [redacted]w[redacted]d being seen today for ongoing prenatal care.  She is currently monitored for the following issues for this high-risk pregnancy and has Sickle cell trait in mother affecting pregnancy ([place]); Maternal iron deficiency anemia affecting pregnancy in third trimester, antepartum; Supervision of high-risk pregnancy; Dichorionic diamniotic twin gestation; Preexisting hypertension complicating pregnancy, antepartum; and COVID-19 affecting pregnancy in second trimester on their problem list.  Patient reports no complaints.  Contractions: Irritability. Vag. Bleeding: None.  Movement: Present. Denies leaking of fluid.   The following portions of the patient's history were reviewed and updated as appropriate: allergies, current medications, past family history, past medical history, past social history, past surgical history and problem list.   Objective:   Vitals:   05/05/20 1607  BP: 139/84  Pulse: (!) 116    Fetal Status: Fetal Heart Rate (bpm): 132/150   Movement: Present     General:  Alert, oriented and cooperative. Patient is in no acute distress.  Skin: Skin is warm and dry. No rash noted.   Cardiovascular: Normal heart rate noted  Respiratory: Normal respiratory effort, no problems with respiration noted  Abdomen: Soft, gravid, appropriate for gestational age.  Pain/Pressure: Absent     Pelvic: Cervical exam deferred        Extremities: Normal range of motion.  Edema: None  Mental Status: Normal mood and affect. Normal behavior. Normal judgment and thought content.   Imaging: [place] MFM FETAL BPP WO NON STRESS  Result Date: 05/05/2020 ----------------------------------------------------------------------  OBSTETRICS REPORT                       (Signed Final 05/05/2020 12:25 pm) ---------------------------------------------------------------------- Patient Info  ID #:       [id number]                           D.O.B.:  1990-08-01 (29 yrs)  Name:       [facility]                  Visit Date: 05/05/2020 12:12 pm ---------------------------------------------------------------------- Performed By  Attending:        Tama High MD        Ref. Address:     [place]  Performed By:     Polo Riley        [facility]  Referred By:      [place] ---------------------------------------------------------------------- Orders  #  Description                           Code        Ordered By  1  [place] MFM FETAL BPP WO NON               76819.01    RAVI SHANKAR     STRESS  2  [place] MFM FETAL BPP WO NST               76819.1     RAVI [facility]     ADDL GESTATION ----------------------------------------------------------------------  #  Order #                     Accession #                Episode #  1  [id number]                   [id number]                 [id number]  2  [id number]                   [id number]                 [id number] ---------------------------------------------------------------------- Indications  [redacted] weeks gestation of pregnancy                Z3A.33  Hypertension - Chronic/Pre-existing            O10.019  (Procardia)  Encounter for other antenatal screening        Z36.2  follow-up  History of sickle cell trait                   Z86.2  Twin pregnancy, di/di, third trimester         O30.043 ---------------------------------------------------------------------- Fetal Evaluation (Fetus A)  Num Of Fetuses:         2  Fetal Heart Rate(bpm):  131  Cardiac Activity:       Observed  Fetal Lie:              Maternal left side; lower fetus  Presentation:           Breech  Placenta:               Anterior  Amniotic  Fluid  AFI FV:      Within normal limits                              Largest Pocket(cm)                              4.8 ---------------------------------------------------------------------- Biophysical Evaluation (Fetus A)  Amniotic F.V:   Within normal limits       F. Tone:        Observed  F. Movement:    Observed                   Score:          8/8  F. Breathing:   Observed ---------------------------------------------------------------------- Biometry (Fetus A)  LV:        2.4  mm ---------------------------------------------------------------------- OB History  Gravidity:    3         Term:   2        Prem:   0        SAB:   0  TOP:          0       Ectopic:  0        Living: 2 ---------------------------------------------------------------------- Gestational Age (Fetus A)  LMP:           40w 3d        Date:  07/27/19                 EDD:   05/02/20  Best:          33w 2d     Det. By:  Loman Chroman         EDD:   06/21/20                                      (11/05/19) ---------------------------------------------------------------------- Anatomy (Fetus A)  Ventricles:            Appears normal         Bladder:                Appears normal  Stomach:               Appears normal, left                         sided  Other:  All other anatomy complete, previously seen as normal. ---------------------------------------------------------------------- Fetal Evaluation (Fetus B)  Num Of Fetuses:         2  Fetal Heart Rate(bpm):  130  Cardiac Activity:       Observed  Fetal Lie:              Upper Fetus; maternal right  Presentation:           Transverse, head to maternal left  Placenta:               Anterior  Amniotic Fluid  AFI FV:      Within normal limits                              Largest Pocket(cm)                              6.6 ---------------------------------------------------------------------- Biophysical Evaluation (Fetus B)  Amniotic F.V:   Within normal limits       F. Tone:         Observed  F. Movement:    Observed                   Score:          8/8  F. Breathing:   Observed ---------------------------------------------------------------------- Gestational Age (Fetus B)  LMP:  40w 3d        Date:  07/27/19                 EDD:   05/02/20  Best:          33w 2d     Det. ByLoman Chroman         EDD:   06/21/20                                      (11/05/19) ---------------------------------------------------------------------- Anatomy (Fetus B)  Palate:                Not well visualized    Kidneys:                Appear normal  Stomach:               Appears normal, left   Bladder:                Appears normal                         sided  Other:  All other anatomy complete, previously seen as normal. ---------------------------------------------------------------------- Impression  Dichorionic-diamniotic twin pregnancy.  Chronic hypertension.  Well controlled on Procardia.  Blood  pressure today at her office is 131/75 mmHg.  Twin A: Lower fetus, maternal left, breech presentation,  anterior placenta.  Amniotic fluid is normal and good fetal  activity seen.Antenatal testing is reassuring. BPP 8/8.  Twin B: Upper fetus, maternal right, transverse lie and head  to maternal left, anterior placenta.Amniotic fluid is normal and  good fetal activity is seen .Antenatal testing is reassuring.  BPP 8/8.  We reassured the patient of the findings. ---------------------------------------------------------------------- Recommendations  -Continue weekly BPP till delivery. ----------------------------------------------------------------------                  Tama High, MD Electronically Signed Final Report   05/05/2020 12:25 pm ----------------------------------------------------------------------  [place] MFM FETAL BPP WO NST ADDL GESTATION  Result Date: 05/05/2020 ----------------------------------------------------------------------  OBSTETRICS REPORT                       (Signed  Final 05/05/2020 12:25 pm) ---------------------------------------------------------------------- Patient Info  ID #:       [id number]                          D.O.B.:  1991-02-16 (29 yrs)  Name:       [facility]                  Visit Date: 05/05/2020 12:12 pm ---------------------------------------------------------------------- Performed By  Attending:        Tama High MD        Ref. Address:     [place]  Performed By:     Polo Riley        [facility]                                                             [place] [facility]                                                             [facility]  Referred By:      [facility] ---------------------------------------------------------------------- Orders  #  Description                           Code        Ordered By  1  [place] MFM FETAL BPP WO NON               76819.01    RAVI SHANKAR     STRESS  2  [place] MFM FETAL BPP WO NST               76819.1     RAVI [facility]     ADDL GESTATION ----------------------------------------------------------------------  #  Order #                     Accession #                Episode #  1  [id number]                   [id number]                 [id number]  2  [id number]                   [id number]                 [id number] ---------------------------------------------------------------------- Indications  [redacted] weeks gestation of pregnancy                Z3A.33  Hypertension - Chronic/Pre-existing            O10.019  (Procardia)  Encounter for other antenatal screening        Z36.2  follow-up  History of sickle cell trait                   Z86.2  Twin pregnancy, di/di, third trimester         O30.043 ---------------------------------------------------------------------- Fetal Evaluation (Fetus A)  Num Of Fetuses:         2  Fetal Heart Rate(bpm):  131  Cardiac  Activity:       Observed  Fetal Lie:              Maternal left side; lower fetus  Presentation:           Breech  Placenta:               Anterior  Amniotic Fluid  AFI FV:      Within normal limits  Largest Pocket(cm)                              4.8 ---------------------------------------------------------------------- Biophysical Evaluation (Fetus A)  Amniotic F.V:   Within normal limits       F. Tone:        Observed  F. Movement:    Observed                   Score:          8/8  F. Breathing:   Observed ---------------------------------------------------------------------- Biometry (Fetus A)  LV:        2.4  mm ---------------------------------------------------------------------- OB History  Gravidity:    3         Term:   2        Prem:   0        SAB:   0  TOP:          0       Ectopic:  0        Living: 2 ---------------------------------------------------------------------- Gestational Age (Fetus A)  LMP:           40w 3d        Date:  07/27/19                 EDD:   05/02/20  Best:          33w 2d     Det. By:  Loman Chroman         EDD:   06/21/20                                      (11/05/19) ---------------------------------------------------------------------- Anatomy (Fetus A)  Ventricles:            Appears normal         Bladder:                Appears normal  Stomach:               Appears normal, left                         sided  Other:  All other anatomy complete, previously seen as normal. ---------------------------------------------------------------------- Fetal Evaluation (Fetus B)  Num Of Fetuses:         2  Fetal Heart Rate(bpm):  130  Cardiac Activity:       Observed  Fetal Lie:              Upper Fetus; maternal right  Presentation:           Transverse, head to maternal left  Placenta:               Anterior  Amniotic Fluid  AFI FV:      Within normal limits                              Largest Pocket(cm)                              6.6  ---------------------------------------------------------------------- Biophysical Evaluation (Fetus B)  Amniotic F.V:   Within normal limits       F. Tone:  Observed  F. Movement:    Observed                   Score:          8/8  F. Breathing:   Observed ---------------------------------------------------------------------- Gestational Age (Fetus B)  LMP:           40w 3d        Date:  07/27/19                 EDD:   05/02/20  Best:          33w 2d     Det. ByLoman Chroman         EDD:   06/21/20                                      (11/05/19) ---------------------------------------------------------------------- Anatomy (Fetus B)  Palate:                Not well visualized    Kidneys:                Appear normal  Stomach:               Appears normal, left   Bladder:                Appears normal                         sided  Other:  All other anatomy complete, previously seen as normal. ---------------------------------------------------------------------- Impression  Dichorionic-diamniotic twin pregnancy.  Chronic hypertension.  Well controlled on Procardia.  Blood  pressure today at her office is 131/75 mmHg.  Twin A: Lower fetus, maternal left, breech presentation,  anterior placenta.  Amniotic fluid is normal and good fetal  activity seen.Antenatal testing is reassuring. BPP 8/8.  Twin B: Upper fetus, maternal right, transverse lie and head  to maternal left, anterior placenta.Amniotic fluid is normal and  good fetal activity is seen .Antenatal testing is reassuring.  BPP 8/8.  We reassured the patient of the findings. ---------------------------------------------------------------------- Recommendations  -Continue weekly BPP till delivery. ----------------------------------------------------------------------                  Tama High, MD Electronically Signed Final Report   05/05/2020 12:25 pm ----------------------------------------------------------------------   Assessment and Plan:   Pregnancy: [id number] at [redacted]w[redacted]d 1. Dichorionic diamniotic twin pregnancy in third trimester Continue weekly BPP. Noncephalic presentations for both. If persists, will need cesarean delivery. Patient is aware.   2. Preexisting hypertension complicating pregnancy, antepartum Stable BP on Procardia XL 60 mg daily. Delivery likely 37-38 weeks, or earlier for severe hypertension.  3. Maternal iron deficiency anemia affecting pregnancy in third trimester, antepartum Hgb 9.3, Ferritin 12.  Counseled about iron infusion, recommended Venofer. She declined this, wants Feraheme as she tolerated this in past. Counseled about possible allergic reactions to Feraheme.  She wants this. Three weekly doses ordered.  4. [redacted] weeks gestation of pregnancy 5. Supervision of high risk pregnancy in third trimester No other concerns.  Preterm labor symptoms and general obstetric precautions including but not limited to vaginal bleeding, contractions, leaking of fluid and fetal movement were reviewed in detail with the patient. Please refer to After Visit Summary for other counseling recommendations.   Return in about 2 weeks (around 05/19/2020) for OFFICE OB VISIT (MD only)  and fasting fingerstick.  Future Appointments  Date Time Provider [place]  05/11/2020  2:45 PM [facility] NURSE [facility]  05/11/2020  3:00 PM [facility] US1 [facility]  05/18/2020  2:45 PM [facility] NURSE [facility]  05/18/2020  3:00 PM [facility] US1 [facility]  05/20/2020 10:00 AM Caren Macadam, MD [facility]  05/25/2020  2:45 PM [facility] NURSE [facility]  05/25/2020  3:00 PM [facility] US1 [facility] [place]    Verita Schneiders, MD

## 2020-05-05 NOTE — Patient Instructions (Signed)
Return to office for any scheduled appointments. Call the office or go to the MAU at [facility] [place] if:  You begin to have strong, frequent contractions  Your water breaks.  Sometimes it is a big gush of fluid, sometimes it is just a trickle that keeps getting your panties wet or running down your legs  You have vaginal bleeding.  It is normal to have a small amount of spotting if your cervix was checked.   You do not feel your baby moving like normal.  If you do not, get something to eat and drink and lay down and focus on feeling your baby move.   If your baby is still not moving like normal, you should call the office or go to MAU.  Any other obstetric concerns.   

## 2020-05-06 ENCOUNTER — Encounter: Payer: Self-pay | Admitting: *Deleted

## 2020-05-11 ENCOUNTER — Ambulatory Visit: Payer: Medicaid Other | Admitting: *Deleted

## 2020-05-11 ENCOUNTER — Encounter: Payer: Self-pay | Admitting: *Deleted

## 2020-05-11 ENCOUNTER — Ambulatory Visit: Payer: Medicaid Other | Attending: Obstetrics and Gynecology

## 2020-05-11 ENCOUNTER — Other Ambulatory Visit: Payer: Self-pay

## 2020-05-11 DIAGNOSIS — O9913 Other diseases of the blood and blood-forming organs and certain disorders involving the immune mechanism complicating the puerperium: Secondary | ICD-10-CM

## 2020-05-11 DIAGNOSIS — Z862 Personal history of diseases of the blood and blood-forming organs and certain disorders involving the immune mechanism: Secondary | ICD-10-CM

## 2020-05-11 DIAGNOSIS — O30043 Twin pregnancy, dichorionic/diamniotic, third trimester: Secondary | ICD-10-CM

## 2020-05-11 DIAGNOSIS — O10919 Unspecified pre-existing hypertension complicating pregnancy, unspecified trimester: Secondary | ICD-10-CM | POA: Insufficient documentation

## 2020-05-11 DIAGNOSIS — D509 Iron deficiency anemia, unspecified: Secondary | ICD-10-CM | POA: Diagnosis present

## 2020-05-11 DIAGNOSIS — O99013 Anemia complicating pregnancy, third trimester: Secondary | ICD-10-CM | POA: Insufficient documentation

## 2020-05-11 DIAGNOSIS — O10013 Pre-existing essential hypertension complicating pregnancy, third trimester: Secondary | ICD-10-CM

## 2020-05-11 DIAGNOSIS — Z3A34 34 weeks gestation of pregnancy: Secondary | ICD-10-CM

## 2020-05-11 NOTE — Discharge Instructions (Signed)
Ferumoxytol injection What is this medicine? FERUMOXYTOL is an iron complex. Iron is used to make healthy red blood cells, which carry oxygen and nutrients throughout the body. This medicine is used to treat iron deficiency anemia. This medicine may be used for other purposes; ask your health care provider or pharmacist if you have questions. COMMON BRAND NAME(S): Feraheme What should I tell my health care provider before I take this medicine? They need to know if you have any of these conditions:  anemia not caused by low iron levels  high levels of iron in the blood  magnetic resonance imaging (MRI) test scheduled  an unusual or allergic reaction to iron, other medicines, foods, dyes, or preservatives  pregnant or trying to get pregnant  breast-feeding How should I use this medicine? This medicine is for injection into a vein. It is given by a health care professional in a hospital or clinic setting. Talk to your pediatrician regarding the use of this medicine in children. Special care may be needed. Overdosage: If you think you have taken too much of this medicine contact a poison control center or emergency room at once. NOTE: This medicine is only for you. Do not share this medicine with others. What if I miss a dose? It is important not to miss your dose. Call your doctor or health care professional if you are unable to keep an appointment. What may interact with this medicine? This medicine may interact with the following medications:  other iron products This list may not describe all possible interactions. Give your health care provider a list of all the medicines, herbs, non-prescription drugs, or dietary supplements you use. Also tell them if you smoke, drink alcohol, or use illegal drugs. Some items may interact with your medicine. What should I watch for while using this medicine? Visit your doctor or healthcare professional regularly. Tell your doctor or healthcare  professional if your symptoms do not start to get better or if they get worse. You may need blood work done while you are taking this medicine. You may need to follow a special diet. Talk to your doctor. Foods that contain iron include: whole grains/cereals, dried fruits, beans, or peas, leafy green vegetables, and organ meats (liver, kidney). What side effects may I notice from receiving this medicine? Side effects that you should report to your doctor or health care professional as soon as possible:  allergic reactions like skin rash, itching or hives, swelling of the face, lips, or tongue  breathing problems  changes in blood pressure  feeling faint or lightheaded, falls  fever or chills  flushing, sweating, or hot feelings  swelling of the ankles or feet Side effects that usually do not require medical attention (report to your doctor or health care professional if they continue or are bothersome):  diarrhea  headache  nausea, vomiting  stomach pain This list may not describe all possible side effects. Call your doctor for medical advice about side effects. You may report side effects to FDA at 1-800-FDA-1088. Where should I keep my medicine? This drug is given in a hospital or clinic and will not be stored at home. NOTE: This sheet is a summary. It may not cover all possible information. If you have questions about this medicine, talk to your doctor, pharmacist, or health care provider.  2021 Elsevier/Gold Standard (2016-05-02 20:21:10)  

## 2020-05-12 ENCOUNTER — Encounter (HOSPITAL_COMMUNITY)
Admission: RE | Admit: 2020-05-12 | Discharge: 2020-05-12 | Disposition: A | Discharge status: Home / Self Care | Payer: Medicaid Other | Source: Ambulatory Visit | Attending: Obstetrics & Gynecology | Admitting: Obstetrics & Gynecology

## 2020-05-12 DIAGNOSIS — D509 Iron deficiency anemia, unspecified: Secondary | ICD-10-CM | POA: Diagnosis present

## 2020-05-12 DIAGNOSIS — O99013 Anemia complicating pregnancy, third trimester: Secondary | ICD-10-CM | POA: Diagnosis not present

## 2020-05-12 MED ORDER — SODIUM CHLORIDE 0.9 % IV SOLN
510.0000 mg | INTRAVENOUS | Status: DC
  Administered 2020-05-12: 510 mg via INTRAVENOUS
  Filled 2020-05-12: qty 510

## 2020-05-18 ENCOUNTER — Ambulatory Visit: Payer: Medicaid Other

## 2020-05-19 ENCOUNTER — Encounter (HOSPITAL_COMMUNITY)
Admission: RE | Admit: 2020-05-19 | Discharge: 2020-05-19 | Disposition: A | Discharge status: Home / Self Care | Payer: Medicaid Other | Source: Ambulatory Visit | Attending: Obstetrics & Gynecology | Admitting: Obstetrics & Gynecology

## 2020-05-19 ENCOUNTER — Other Ambulatory Visit: Payer: Self-pay

## 2020-05-19 DIAGNOSIS — O99013 Anemia complicating pregnancy, third trimester: Secondary | ICD-10-CM | POA: Diagnosis not present

## 2020-05-19 DIAGNOSIS — D509 Iron deficiency anemia, unspecified: Secondary | ICD-10-CM

## 2020-05-19 MED ORDER — SODIUM CHLORIDE 0.9 % IV SOLN
510.0000 mg | INTRAVENOUS | Status: DC
  Administered 2020-05-19: 510 mg via INTRAVENOUS
  Filled 2020-05-19: qty 510

## 2020-05-20 ENCOUNTER — Ambulatory Visit (INDEPENDENT_AMBULATORY_CARE_PROVIDER_SITE_OTHER): Payer: Medicaid Other | Admitting: Family Medicine

## 2020-05-20 ENCOUNTER — Encounter: Payer: Self-pay | Admitting: *Deleted

## 2020-05-20 VITALS — BP 144/84 | HR 109 | Wt 197.0 lb

## 2020-05-20 DIAGNOSIS — O30043 Twin pregnancy, dichorionic/diamniotic, third trimester: Secondary | ICD-10-CM

## 2020-05-20 DIAGNOSIS — U071 COVID-19: Secondary | ICD-10-CM

## 2020-05-20 DIAGNOSIS — O10919 Unspecified pre-existing hypertension complicating pregnancy, unspecified trimester: Secondary | ICD-10-CM

## 2020-05-20 DIAGNOSIS — O99013 Anemia complicating pregnancy, third trimester: Secondary | ICD-10-CM

## 2020-05-20 DIAGNOSIS — D509 Iron deficiency anemia, unspecified: Secondary | ICD-10-CM

## 2020-05-20 DIAGNOSIS — O0993 Supervision of high risk pregnancy, unspecified, third trimester: Secondary | ICD-10-CM

## 2020-05-20 DIAGNOSIS — O98512 Other viral diseases complicating pregnancy, second trimester: Secondary | ICD-10-CM

## 2020-05-20 MED ORDER — GLUCOSE BLOOD VI STRP: ORAL_STRIP | 2 refills | Status: DC

## 2020-05-20 MED ORDER — ACCU-CHEK NANO SMARTVIEW W/DEVICE KIT: 1.0000 | PACK | 0 refills | Status: DC

## 2020-05-20 MED ORDER — ACCU-CHEK SOFTCLIX LANCETS MISC: 1.0000 | Freq: Four times a day (QID) | 2 refills | Status: DC

## 2020-05-20 NOTE — Progress Notes (Signed)
   PRENATAL VISIT NOTE  Subjective:  Mckenzie Nelson is a 30 y.o. G3P2002 at [redacted]w[redacted]d being seen today for ongoing prenatal care.  She is currently monitored for the following issues for this high-risk pregnancy and has Sickle cell trait in mother affecting pregnancy ([place]); Maternal iron deficiency anemia affecting pregnancy in third trimester, antepartum; Supervision of high-risk pregnancy; Dichorionic diamniotic twin gestation; Preexisting hypertension complicating pregnancy, antepartum; and COVID-19 affecting pregnancy in second trimester on their problem list.  Patient reports no complaints.  Contractions: Irritability. Vag. Bleeding: None.  Movement: Present. Denies leaking of fluid.   Ate at 4-5 AM, finger stick is 112   The following portions of the patient's history were reviewed and updated as appropriate: allergies, current medications, past family history, past medical history, past social history, past surgical history and problem list.   Objective:   Vitals:   05/20/20 1023  BP: (!) 144/84  Pulse: (!) 109  Weight: 197 lb (89.4 kg)    Fetal Status: Fetal Heart Rate (bpm): 131/137   Movement: Present     General:  Alert, oriented and cooperative. Patient is in no acute distress.  Skin: Skin is warm and dry. No rash noted.   Cardiovascular: Normal heart rate noted  Respiratory: Normal respiratory effort, no problems with respiration noted  Abdomen: Soft, gravid, appropriate for gestational age.  Pain/Pressure: Absent     Pelvic: Cervical exam deferred        Extremities: Normal range of motion.     Mental Status: Normal mood and affect. Normal behavior. Normal judgment and thought content.   Assessment and Plan:  Pregnancy: G3P2002 at [redacted]w[redacted]d  1. Maternal iron deficiency anemia affecting pregnancy in third trimester, antepartum S/p fereheme 2/15 and 2/22  2. Supervision of high risk pregnancy in third trimester Declined GTT, finger stick is 112 today (5 hrs postprandial).   Agreed to check glucose-- rx sent for glucometer, lancets and test strips Instructed to check Fasting, and 1-2 hrs pp. Reviewed importance of knowing her glucose status. She will check for 1 week and return with values.    3. Preexisting hypertension complicating pregnancy, antepartum Elevated today Denies HA, RUQ pain, scotomata, swelling Monitor BP closely  4. Dichorionic diamniotic twin pregnancy in third trimester Has appropriate testing scheduled Twin A has been breech on prior scans IOL vs CS at 38 wks  5. COVID-19 affecting pregnancy in second trimester No changes in managment  Preterm labor symptoms and general obstetric precautions including but not limited to vaginal bleeding, contractions, leaking of fluid and fetal movement were reviewed in detail with the patient. Please refer to After Visit Summary for other counseling recommendations.   Return in about 1 week (around 05/27/2020) for Routine prenatal care, 36wks.  Future Appointments  Date Time Provider [place]  05/21/2020  3:30 PM [facility] NURSE [facility]  05/21/2020  3:45 PM [facility] US1 [facility]  05/25/2020  2:45 PM [facility] NURSE [facility]  05/25/2020  3:00 PM [facility] US1 [facility]  05/26/2020 11:00 AM [facility] None    Caren Macadam, MD

## 2020-05-21 ENCOUNTER — Ambulatory Visit: Payer: Medicaid Other | Attending: Obstetrics and Gynecology | Admitting: *Deleted

## 2020-05-21 ENCOUNTER — Other Ambulatory Visit: Payer: Self-pay

## 2020-05-21 ENCOUNTER — Ambulatory Visit (HOSPITAL_BASED_OUTPATIENT_CLINIC_OR_DEPARTMENT_OTHER): Payer: Medicaid Other

## 2020-05-21 ENCOUNTER — Encounter: Payer: Self-pay | Admitting: *Deleted

## 2020-05-21 DIAGNOSIS — O322XX2 Maternal care for transverse and oblique lie, fetus 2: Secondary | ICD-10-CM | POA: Diagnosis not present

## 2020-05-21 DIAGNOSIS — O30043 Twin pregnancy, dichorionic/diamniotic, third trimester: Secondary | ICD-10-CM | POA: Diagnosis present

## 2020-05-21 DIAGNOSIS — Z862 Personal history of diseases of the blood and blood-forming organs and certain disorders involving the immune mechanism: Secondary | ICD-10-CM | POA: Insufficient documentation

## 2020-05-21 DIAGNOSIS — O10019 Pre-existing essential hypertension complicating pregnancy, unspecified trimester: Secondary | ICD-10-CM | POA: Insufficient documentation

## 2020-05-21 DIAGNOSIS — O321XX1 Maternal care for breech presentation, fetus 1: Secondary | ICD-10-CM | POA: Diagnosis not present

## 2020-05-21 DIAGNOSIS — O10013 Pre-existing essential hypertension complicating pregnancy, third trimester: Secondary | ICD-10-CM

## 2020-05-21 DIAGNOSIS — Z3A35 35 weeks gestation of pregnancy: Secondary | ICD-10-CM | POA: Insufficient documentation

## 2020-05-21 DIAGNOSIS — O10919 Unspecified pre-existing hypertension complicating pregnancy, unspecified trimester: Secondary | ICD-10-CM

## 2020-05-21 DIAGNOSIS — D509 Iron deficiency anemia, unspecified: Secondary | ICD-10-CM

## 2020-05-25 ENCOUNTER — Ambulatory Visit: Payer: Medicaid Other

## 2020-05-26 ENCOUNTER — Other Ambulatory Visit: Payer: Self-pay

## 2020-05-26 ENCOUNTER — Ambulatory Visit (HOSPITAL_COMMUNITY)
Admission: RE | Admit: 2020-05-26 | Discharge: 2020-05-26 | Disposition: A | Discharge status: Home / Self Care | Payer: Medicaid Other | Source: Ambulatory Visit | Attending: Obstetrics & Gynecology | Admitting: Obstetrics & Gynecology

## 2020-05-26 DIAGNOSIS — O99013 Anemia complicating pregnancy, third trimester: Secondary | ICD-10-CM | POA: Diagnosis present

## 2020-05-26 DIAGNOSIS — D509 Iron deficiency anemia, unspecified: Secondary | ICD-10-CM | POA: Diagnosis present

## 2020-05-26 MED ORDER — SODIUM CHLORIDE 0.9 % IV SOLN
510.0000 mg | INTRAVENOUS | Status: DC
  Administered 2020-05-26: 510 mg via INTRAVENOUS
  Filled 2020-05-26: qty 510

## 2020-05-28 ENCOUNTER — Other Ambulatory Visit (HOSPITAL_COMMUNITY)
Admission: RE | Admit: 2020-05-28 | Discharge: 2020-05-28 | Disposition: A | Discharge status: Home / Self Care | Payer: Medicaid Other | Source: Ambulatory Visit | Attending: Family Medicine | Admitting: Family Medicine

## 2020-05-28 ENCOUNTER — Ambulatory Visit (INDEPENDENT_AMBULATORY_CARE_PROVIDER_SITE_OTHER): Payer: Medicaid Other | Admitting: Family Medicine

## 2020-05-28 ENCOUNTER — Encounter: Payer: Self-pay | Admitting: Family Medicine

## 2020-05-28 ENCOUNTER — Other Ambulatory Visit: Payer: Self-pay

## 2020-05-28 ENCOUNTER — Encounter: Payer: Self-pay | Admitting: *Deleted

## 2020-05-28 VITALS — BP 126/80 | HR 99 | Wt 198.0 lb

## 2020-05-28 DIAGNOSIS — D573 Sickle-cell trait: Secondary | ICD-10-CM

## 2020-05-28 DIAGNOSIS — O99019 Anemia complicating pregnancy, unspecified trimester: Secondary | ICD-10-CM

## 2020-05-28 DIAGNOSIS — O10919 Unspecified pre-existing hypertension complicating pregnancy, unspecified trimester: Secondary | ICD-10-CM

## 2020-05-28 DIAGNOSIS — O0993 Supervision of high risk pregnancy, unspecified, third trimester: Secondary | ICD-10-CM

## 2020-05-28 DIAGNOSIS — O99013 Anemia complicating pregnancy, third trimester: Secondary | ICD-10-CM

## 2020-05-28 DIAGNOSIS — D509 Iron deficiency anemia, unspecified: Secondary | ICD-10-CM

## 2020-05-28 DIAGNOSIS — O30043 Twin pregnancy, dichorionic/diamniotic, third trimester: Secondary | ICD-10-CM

## 2020-05-28 NOTE — Patient Instructions (Signed)

## 2020-05-28 NOTE — Progress Notes (Signed)
   PRENATAL VISIT NOTE  Subjective:  Mckenzie Nelson is a 30 y.o. G3P2002 at [redacted]w[redacted]d being seen today for ongoing prenatal care.  She is currently monitored for the following issues for this high-risk pregnancy and has Sickle cell trait in mother affecting pregnancy ([place]); Maternal iron deficiency anemia affecting pregnancy in third trimester, antepartum; Supervision of high-risk pregnancy; Dichorionic diamniotic twin gestation; Preexisting hypertension complicating pregnancy, antepartum; and COVID-19 affecting pregnancy in second trimester on their problem list.  Patient reports no complaints.  Contractions: Irregular. Vag. Bleeding: None.  Movement: Present (Simultaneous filing. User may not have seen previous data.). Denies leaking of fluid.   The following portions of the patient's history were reviewed and updated as appropriate: allergies, current medications, past family history, past medical history, past social history, past surgical history and problem list.   Objective:   Vitals:   05/28/20 1322  BP: 126/80  Pulse: 99  Weight: 198 lb (89.8 kg)    Fetal Status: Fetal Heart Rate (bpm): 146/132 Fundal Height: 48 cm Movement: Present (Simultaneous filing. User may not have seen previous data.)  Presentation: Complete Breech  General:  Alert, oriented and cooperative. Patient is in no acute distress.  Skin: Skin is warm and dry. No rash noted.   Cardiovascular: Normal heart rate noted  Respiratory: Normal respiratory effort, no problems with respiration noted  Abdomen: Soft, gravid, appropriate for gestational age.  Pain/Pressure: Present     Pelvic: Cervical exam performed in the presence of a chaperone Dilation: 3 Effacement (%): Thick Station: Ballotable  Extremities: Normal range of motion.     Mental Status: Normal mood and affect. Normal behavior. Normal judgment and thought content.   Assessment and Plan:  Pregnancy: G3P2002 at [redacted]w[redacted]d 1. Preexisting hypertension complicating  pregnancy, antepartum BP is well controlled on and ASA  2. Supervision of high risk pregnancy in third trimester Cultures today - Strep Gp B NAA - GC/Chlamydia probe amp ([place])not at [facility]  3. Dichorionic diamniotic twin pregnancy in third trimester Appropriately grown Malpresentation--> for Primary C-section, orders placed, message sent.  4. Maternal iron deficiency anemia affecting pregnancy in third trimester, antepartum   5. Sickle cell trait in mother affecting pregnancy ([place])   Preterm labor symptoms and general obstetric precautions including but not limited to vaginal bleeding, contractions, leaking of fluid and fetal movement were reviewed in detail with the patient. Please refer to After Visit Summary for other counseling recommendations.   Return in 1 week (on 06/04/2020).  Future Appointments  Date Time Provider [place]  06/03/2020  1:30 PM Caren Macadam, MD [facility]  06/03/2020  2:30 PM [facility] NURSE [facility]  06/03/2020  2:45 PM [facility] US5 [facility]  06/09/2020 11:45 AM Donnamae Jude, MD [facility]    Donnamae Jude, MD

## 2020-05-29 ENCOUNTER — Encounter (HOSPITAL_COMMUNITY): Payer: Self-pay

## 2020-05-29 LAB — GC/CHLAMYDIA PROBE AMP (~~LOC~~) NOT AT ARMC
Chlamydia: NEGATIVE
Comment: NEGATIVE
Comment: NORMAL
Neisseria Gonorrhea: NEGATIVE

## 2020-05-29 NOTE — Patient Instructions (Signed)
Mckenzie Nelson  05/29/2020   Your procedure is scheduled on:  06/08/2020  Arrive at 23 at [employer] on [address] at [facility]  and [employer]. You are invited to use the FREE valet parking or use the Visitor's parking deck.  Pick up the phone at the desk and dial [phone].  Call this number if you have problems the morning of surgery: [phone]  Remember:   Do not eat food:(After Midnight) Desps de medianoche.  Do not drink clear liquids: (After Midnight) Desps de medianoche.  Take these medicines the morning of surgery with A SIP OF WATER:  Take nifedipine  As prescribed   Do not wear jewelry, make-up or nail polish.  Do not wear lotions, powders, or perfumes. Do not wear deodorant.  Do not shave 48 hours prior to surgery.  Do not bring valuables to the hospital.  [facility] is not   responsible for any belongings or valuables brought to the hospital.  Contacts, dentures or bridgework may not be worn into surgery.  Leave suitcase in the car. After surgery it may be brought to your room.  For patients admitted to the hospital, checkout time is 11:00 AM the day of              discharge.      Please read over the following fact sheets that you were given:     Preparing for Surgery

## 2020-05-30 LAB — STREP GP B NAA: Strep Gp B NAA: POSITIVE — AB

## 2020-06-01 ENCOUNTER — Encounter: Payer: Self-pay | Admitting: Family Medicine

## 2020-06-01 DIAGNOSIS — O99820 Streptococcus B carrier state complicating pregnancy: Secondary | ICD-10-CM | POA: Insufficient documentation

## 2020-06-03 ENCOUNTER — Ambulatory Visit: Payer: Medicaid Other

## 2020-06-03 ENCOUNTER — Encounter: Payer: Self-pay | Admitting: *Deleted

## 2020-06-03 ENCOUNTER — Other Ambulatory Visit: Payer: Self-pay

## 2020-06-03 ENCOUNTER — Encounter (HOSPITAL_COMMUNITY): Payer: Self-pay

## 2020-06-03 ENCOUNTER — Ambulatory Visit: Payer: Medicaid Other | Admitting: *Deleted

## 2020-06-03 ENCOUNTER — Ambulatory Visit: Payer: Medicaid Other | Attending: Obstetrics and Gynecology

## 2020-06-03 ENCOUNTER — Ambulatory Visit (INDEPENDENT_AMBULATORY_CARE_PROVIDER_SITE_OTHER): Payer: Medicaid Other | Admitting: Family Medicine

## 2020-06-03 VITALS — BP 133/84 | HR 99 | Wt 199.0 lb

## 2020-06-03 DIAGNOSIS — O322XX2 Maternal care for transverse and oblique lie, fetus 2: Secondary | ICD-10-CM | POA: Diagnosis not present

## 2020-06-03 DIAGNOSIS — D509 Iron deficiency anemia, unspecified: Secondary | ICD-10-CM

## 2020-06-03 DIAGNOSIS — Z3A37 37 weeks gestation of pregnancy: Secondary | ICD-10-CM

## 2020-06-03 DIAGNOSIS — O30043 Twin pregnancy, dichorionic/diamniotic, third trimester: Secondary | ICD-10-CM | POA: Insufficient documentation

## 2020-06-03 DIAGNOSIS — O10919 Unspecified pre-existing hypertension complicating pregnancy, unspecified trimester: Secondary | ICD-10-CM | POA: Diagnosis present

## 2020-06-03 DIAGNOSIS — O10013 Pre-existing essential hypertension complicating pregnancy, third trimester: Secondary | ICD-10-CM | POA: Diagnosis not present

## 2020-06-03 DIAGNOSIS — O99013 Anemia complicating pregnancy, third trimester: Secondary | ICD-10-CM

## 2020-06-03 DIAGNOSIS — O99820 Streptococcus B carrier state complicating pregnancy: Secondary | ICD-10-CM

## 2020-06-03 DIAGNOSIS — Z862 Personal history of diseases of the blood and blood-forming organs and certain disorders involving the immune mechanism: Secondary | ICD-10-CM

## 2020-06-03 DIAGNOSIS — O321XX1 Maternal care for breech presentation, fetus 1: Secondary | ICD-10-CM | POA: Diagnosis not present

## 2020-06-03 DIAGNOSIS — O0993 Supervision of high risk pregnancy, unspecified, third trimester: Secondary | ICD-10-CM

## 2020-06-03 NOTE — Progress Notes (Signed)
   PRENATAL VISIT NOTE  Subjective:  Mckenzie Nelson is a 30 y.o. G3P2002 at [redacted]w[redacted]d being seen today for ongoing prenatal care.  She is currently monitored for the following issues for this high-risk pregnancy and has Sickle cell trait in mother affecting pregnancy ([place]); Maternal iron deficiency anemia affecting pregnancy in third trimester, antepartum; Supervision of high-risk pregnancy; Dichorionic diamniotic twin gestation; Preexisting hypertension complicating pregnancy, antepartum; COVID-19 affecting pregnancy in second trimester; and Group B Streptococcus carrier, +RV culture, currently pregnant on their problem list.  Patient reports no complaints.  Contractions: Irregular. Vag. Bleeding: None.  Movement: Present. Denies leaking of fluid.   The following portions of the patient's history were reviewed and updated as appropriate: allergies, current medications, past family history, past medical history, past social history, past surgical history and problem list.   Objective:   Vitals:   06/03/20 1355  BP: 133/84  Pulse: 99  Weight: 199 lb (90.3 kg)    Fetal Status: Fetal Heart Rate (bpm): 131/140   Movement: Present     General:  Alert, oriented and cooperative. Patient is in no acute distress.  Skin: Skin is warm and dry. No rash noted.   Cardiovascular: Normal heart rate noted  Respiratory: Normal respiratory effort, no problems with respiration noted  Abdomen: Soft, gravid, appropriate for gestational age.  Pain/Pressure: Present     Pelvic: Cervical exam deferred        Extremities: Normal range of motion.  Edema: Trace  Mental Status: Normal mood and affect. Normal behavior. Normal judgment and thought content.   Assessment and Plan:  Pregnancy: G3P2002 at [redacted]w[redacted]d 1. Supervision of high risk pregnancy in third trimester Has CS scheduled  Discussed family centered CS and desire clear drape to see delivery of infants Discussed NICU will be present at delivery Encouraged to  think about who she wants to announce the sex of the twins to the room.   2. Group B Streptococcus carrier, +RV culture, currently pregnant Will get anceph   3. Preexisting hypertension complicating pregnancy, antepartum BP WNL  4. Maternal iron deficiency anemia affecting pregnancy in third trimester, antepartum HGB is not optimized but just received IV Iron on 05/26/20 Lab Results  Component Value Date   HGB 9.3 (L) 05/01/2020   HGB 10.1 (L) 03/19/2020   HGB 12.0 12/23/2019   5. Dichorionic diamniotic twin pregnancy in third trimester Testing has been normal CS scheduled  Term labor symptoms and general obstetric precautions including but not limited to vaginal bleeding, contractions, leaking of fluid and fetal movement were reviewed in detail with the patient. Please refer to After Visit Summary for other counseling recommendations.   Return in about 2 weeks (around 06/17/2020) for Incision check.  Future Appointments  Date Time Provider [place]  06/03/2020  3:30 PM [facility] NURSE [facility]  06/03/2020  3:45 PM [facility] US1 [facility]  06/05/2020  9:15 AM MC-SCREENING [facility]SD[facility] None  06/05/2020 10:00 AM MC-LD PAT 1 [facility]-INDC None    Caren Macadam, MD

## 2020-06-05 ENCOUNTER — Other Ambulatory Visit: Payer: Self-pay

## 2020-06-05 ENCOUNTER — Other Ambulatory Visit (HOSPITAL_COMMUNITY)
Admission: RE | Admit: 2020-06-05 | Discharge: 2020-06-05 | Disposition: A | Discharge status: Home / Self Care | Payer: Medicaid Other | Source: Ambulatory Visit | Attending: Family Medicine | Admitting: Family Medicine

## 2020-06-05 ENCOUNTER — Encounter (HOSPITAL_COMMUNITY)
Admission: RE | Admit: 2020-06-05 | Discharge: 2020-06-05 | Disposition: A | Discharge status: Home / Self Care | Payer: Medicaid Other | Source: Ambulatory Visit | Attending: Family Medicine | Admitting: Family Medicine

## 2020-06-05 DIAGNOSIS — Z01812 Encounter for preprocedural laboratory examination: Secondary | ICD-10-CM | POA: Insufficient documentation

## 2020-06-05 DIAGNOSIS — Z20822 Contact with and (suspected) exposure to covid-19: Secondary | ICD-10-CM | POA: Diagnosis not present

## 2020-06-05 LAB — CBC
HCT: 33.5 % — ABNORMAL LOW (ref 36.0–46.0)
Hemoglobin: 11.6 g/dL — ABNORMAL LOW (ref 12.0–15.0)
MCH: 27.6 pg (ref 26.0–34.0)
MCHC: 34.6 g/dL (ref 30.0–36.0)
MCV: 79.6 fL — ABNORMAL LOW (ref 80.0–100.0)
Platelets: 260 10*3/uL (ref 150–400)
RBC: 4.21 MIL/uL (ref 3.87–5.11)
RDW: 24.1 % — ABNORMAL HIGH (ref 11.5–15.5)
WBC: 6.9 10*3/uL (ref 4.0–10.5)
nRBC: 0 % (ref 0.0–0.2)

## 2020-06-05 LAB — TYPE AND SCREEN
ABO/RH(D): B POS
Antibody Screen: NEGATIVE

## 2020-06-05 LAB — SARS CORONAVIRUS 2 (TAT 6-24 HRS): SARS Coronavirus 2: NEGATIVE

## 2020-06-06 LAB — RPR: RPR Ser Ql: NONREACTIVE

## 2020-06-08 ENCOUNTER — Encounter (HOSPITAL_COMMUNITY)
Admission: RE | Disposition: A | Discharge status: Home / Self Care | Payer: Self-pay | Source: Home / Self Care | Attending: Family Medicine

## 2020-06-08 ENCOUNTER — Inpatient Hospital Stay (HOSPITAL_COMMUNITY): Payer: Medicaid Other | Admitting: Certified Registered Nurse Anesthetist

## 2020-06-08 ENCOUNTER — Inpatient Hospital Stay (HOSPITAL_COMMUNITY)
Admission: RE | Admit: 2020-06-08 | Discharge: 2020-06-10 | DRG: 787 | Disposition: A | Discharge status: Home / Self Care | Payer: Medicaid Other | Attending: Family Medicine | Admitting: Family Medicine

## 2020-06-08 ENCOUNTER — Encounter (HOSPITAL_COMMUNITY): Payer: Self-pay | Admitting: Family Medicine

## 2020-06-08 ENCOUNTER — Other Ambulatory Visit: Payer: Self-pay

## 2020-06-08 DIAGNOSIS — O321XX0 Maternal care for breech presentation, not applicable or unspecified: Secondary | ICD-10-CM

## 2020-06-08 DIAGNOSIS — O99820 Streptococcus B carrier state complicating pregnancy: Secondary | ICD-10-CM

## 2020-06-08 DIAGNOSIS — O98512 Other viral diseases complicating pregnancy, second trimester: Secondary | ICD-10-CM | POA: Diagnosis present

## 2020-06-08 DIAGNOSIS — O30043 Twin pregnancy, dichorionic/diamniotic, third trimester: Principal | ICD-10-CM | POA: Diagnosis present

## 2020-06-08 DIAGNOSIS — Z98891 History of uterine scar from previous surgery: Secondary | ICD-10-CM

## 2020-06-08 DIAGNOSIS — O1002 Pre-existing essential hypertension complicating childbirth: Secondary | ICD-10-CM | POA: Diagnosis present

## 2020-06-08 DIAGNOSIS — O99824 Streptococcus B carrier state complicating childbirth: Secondary | ICD-10-CM | POA: Diagnosis present

## 2020-06-08 DIAGNOSIS — O358XX1 Maternal care for other (suspected) fetal abnormality and damage, fetus 1: Secondary | ICD-10-CM | POA: Diagnosis present

## 2020-06-08 DIAGNOSIS — U071 COVID-19: Secondary | ICD-10-CM | POA: Diagnosis present

## 2020-06-08 DIAGNOSIS — Z3A38 38 weeks gestation of pregnancy: Secondary | ICD-10-CM

## 2020-06-08 DIAGNOSIS — Z8616 Personal history of COVID-19: Secondary | ICD-10-CM | POA: Diagnosis not present

## 2020-06-08 DIAGNOSIS — O9902 Anemia complicating childbirth: Secondary | ICD-10-CM | POA: Diagnosis present

## 2020-06-08 DIAGNOSIS — D573 Sickle-cell trait: Secondary | ICD-10-CM | POA: Diagnosis present

## 2020-06-08 DIAGNOSIS — Z87891 Personal history of nicotine dependence: Secondary | ICD-10-CM | POA: Diagnosis not present

## 2020-06-08 DIAGNOSIS — O30049 Twin pregnancy, dichorionic/diamniotic, unspecified trimester: Secondary | ICD-10-CM | POA: Diagnosis present

## 2020-06-08 DIAGNOSIS — O321XX1 Maternal care for breech presentation, fetus 1: Secondary | ICD-10-CM | POA: Diagnosis present

## 2020-06-08 DIAGNOSIS — O99214 Obesity complicating childbirth: Secondary | ICD-10-CM | POA: Diagnosis present

## 2020-06-08 DIAGNOSIS — O0990 Supervision of high risk pregnancy, unspecified, unspecified trimester: Secondary | ICD-10-CM

## 2020-06-08 DIAGNOSIS — O322XX2 Maternal care for transverse and oblique lie, fetus 2: Secondary | ICD-10-CM | POA: Diagnosis present

## 2020-06-08 DIAGNOSIS — D509 Iron deficiency anemia, unspecified: Secondary | ICD-10-CM | POA: Diagnosis present

## 2020-06-08 DIAGNOSIS — O134 Gestational [pregnancy-induced] hypertension without significant proteinuria, complicating childbirth: Secondary | ICD-10-CM

## 2020-06-08 DIAGNOSIS — R160 Hepatomegaly, not elsewhere classified: Secondary | ICD-10-CM | POA: Diagnosis present

## 2020-06-08 DIAGNOSIS — O10919 Unspecified pre-existing hypertension complicating pregnancy, unspecified trimester: Secondary | ICD-10-CM | POA: Diagnosis present

## 2020-06-08 LAB — PREPARE RBC (CROSSMATCH)

## 2020-06-08 SURGERY — Surgical Case
Anesthesia: Spinal | Wound class: Clean Contaminated

## 2020-06-08 MED ORDER — OXYTOCIN-SODIUM CHLORIDE 30-0.9 UT/500ML-% IV SOLN
INTRAVENOUS | Status: AC
  Filled 2020-06-08: qty 500

## 2020-06-08 MED ORDER — PROMETHAZINE HCL 25 MG/ML IJ SOLN: 6.2500 mg | INTRAMUSCULAR | Status: DC | PRN

## 2020-06-08 MED ORDER — NALBUPHINE HCL 10 MG/ML IJ SOLN: 5.0000 mg | INTRAMUSCULAR | Status: DC | PRN

## 2020-06-08 MED ORDER — SENNOSIDES-DOCUSATE SODIUM 8.6-50 MG PO TABS
2.0000 | ORAL_TABLET | ORAL | Status: DC
  Administered 2020-06-08 – 2020-06-09 (×2): 2 via ORAL
  Filled 2020-06-08 (×2): qty 2

## 2020-06-08 MED ORDER — ONDANSETRON HCL 4 MG/2ML IJ SOLN
INTRAMUSCULAR | Status: AC
  Filled 2020-06-08: qty 2

## 2020-06-08 MED ORDER — SIMETHICONE 80 MG PO CHEW: 80.0000 mg | CHEWABLE_TABLET | ORAL | Status: DC | PRN

## 2020-06-08 MED ORDER — IBUPROFEN 800 MG PO TABS
800.0000 mg | ORAL_TABLET | Freq: Four times a day (QID) | ORAL | Status: DC
  Administered 2020-06-09 – 2020-06-10 (×4): 800 mg via ORAL
  Filled 2020-06-08 (×4): qty 1

## 2020-06-08 MED ORDER — FENTANYL CITRATE (PF) 100 MCG/2ML IJ SOLN
INTRAMUSCULAR | Status: DC | PRN
  Administered 2020-06-08: 15 ug via INTRATHECAL

## 2020-06-08 MED ORDER — SOD CITRATE-CITRIC ACID 500-334 MG/5ML PO SOLN
ORAL | Status: AC
  Filled 2020-06-08: qty 30

## 2020-06-08 MED ORDER — SODIUM CHLORIDE 0.9% IV SOLUTION: Freq: Once | INTRAVENOUS | Status: DC

## 2020-06-08 MED ORDER — NALOXONE HCL 0.4 MG/ML IJ SOLN: 0.4000 mg | INTRAMUSCULAR | Status: DC | PRN

## 2020-06-08 MED ORDER — KETOROLAC TROMETHAMINE 30 MG/ML IJ SOLN
INTRAMUSCULAR | Status: AC
  Filled 2020-06-08: qty 1

## 2020-06-08 MED ORDER — MORPHINE SULFATE (PF) 0.5 MG/ML IJ SOLN
INTRAMUSCULAR | Status: DC | PRN
  Administered 2020-06-08: 150 ug via INTRATHECAL

## 2020-06-08 MED ORDER — NALBUPHINE HCL 10 MG/ML IJ SOLN
5.0000 mg | INTRAMUSCULAR | Status: DC | PRN
Start: 2020-06-08 — End: 2020-06-10

## 2020-06-08 MED ORDER — MENTHOL 3 MG MT LOZG: 1.0000 | LOZENGE | OROMUCOSAL | Status: DC | PRN

## 2020-06-08 MED ORDER — CHLORHEXIDINE GLUCONATE 0.12 % MT SOLN
OROMUCOSAL | Status: AC
  Filled 2020-06-08: qty 15

## 2020-06-08 MED ORDER — PHENYLEPHRINE HCL (PRESSORS) 10 MG/ML IV SOLN
INTRAVENOUS | Status: DC | PRN
  Administered 2020-06-08 (×2): 40 ug via INTRAVENOUS
  Administered 2020-06-08: 80 ug via INTRAVENOUS
  Administered 2020-06-08 (×4): 40 ug via INTRAVENOUS

## 2020-06-08 MED ORDER — MEPERIDINE HCL 25 MG/ML IJ SOLN: 6.2500 mg | INTRAMUSCULAR | Status: DC | PRN

## 2020-06-08 MED ORDER — MEASLES, MUMPS & RUBELLA VAC IJ SOLR: 0.5000 mL | Freq: Once | INTRAMUSCULAR | Status: DC

## 2020-06-08 MED ORDER — ACETAMINOPHEN 500 MG PO TABS
ORAL_TABLET | ORAL | Status: AC
  Filled 2020-06-08: qty 2

## 2020-06-08 MED ORDER — MORPHINE SULFATE (PF) 0.5 MG/ML IJ SOLN
INTRAMUSCULAR | Status: AC
  Filled 2020-06-08: qty 10

## 2020-06-08 MED ORDER — OXYCODONE HCL 5 MG PO TABS
5.0000 mg | ORAL_TABLET | ORAL | Status: DC | PRN
Start: 2020-06-08 — End: 2020-06-10

## 2020-06-08 MED ORDER — OXYTOCIN-SODIUM CHLORIDE 30-0.9 UT/500ML-% IV SOLN
INTRAVENOUS | Status: DC | PRN
  Administered 2020-06-08: 30 [IU] via INTRAVENOUS

## 2020-06-08 MED ORDER — LACTATED RINGERS IV SOLN: INTRAVENOUS | Status: DC

## 2020-06-08 MED ORDER — BUPIVACAINE IN DEXTROSE 0.75-8.25 % IT SOLN
INTRATHECAL | Status: DC | PRN
  Administered 2020-06-08: 1.6 mL via INTRATHECAL

## 2020-06-08 MED ORDER — COCONUT OIL OIL
1.0000 | TOPICAL_OIL | Status: DC | PRN
Start: 2020-06-08 — End: 2020-06-10

## 2020-06-08 MED ORDER — GABAPENTIN 300 MG PO CAPS
ORAL_CAPSULE | ORAL | Status: AC
  Filled 2020-06-08: qty 1

## 2020-06-08 MED ORDER — PRENATAL MULTIVITAMIN CH
1.0000 | ORAL_TABLET | Freq: Every day | ORAL | Status: DC
  Administered 2020-06-08 – 2020-06-09 (×2): 1 via ORAL
  Filled 2020-06-08 (×2): qty 1

## 2020-06-08 MED ORDER — WITCH HAZEL-GLYCERIN EX PADS
1.0000 | MEDICATED_PAD | CUTANEOUS | Status: DC | PRN
Start: 2020-06-08 — End: 2020-06-10

## 2020-06-08 MED ORDER — CHLORHEXIDINE GLUCONATE 0.12 % MT SOLN
15.0000 mL | Freq: Once | OROMUCOSAL | Status: AC
  Administered 2020-06-08: 15 mL via OROMUCOSAL

## 2020-06-08 MED ORDER — KETOROLAC TROMETHAMINE 30 MG/ML IJ SOLN
30.0000 mg | Freq: Four times a day (QID) | INTRAMUSCULAR | Status: AC
  Administered 2020-06-08 – 2020-06-09 (×3): 30 mg via INTRAVENOUS
  Filled 2020-06-08 (×4): qty 1

## 2020-06-08 MED ORDER — NIFEDIPINE ER OSMOTIC RELEASE 30 MG PO TB24
30.0000 mg | ORAL_TABLET | Freq: Every day | ORAL | Status: DC
  Filled 2020-06-08: qty 1

## 2020-06-08 MED ORDER — PHENYLEPHRINE HCL-NACL 20-0.9 MG/250ML-% IV SOLN
INTRAVENOUS | Status: AC
  Filled 2020-06-08: qty 250

## 2020-06-08 MED ORDER — CEFAZOLIN SODIUM-DEXTROSE 2-4 GM/100ML-% IV SOLN
INTRAVENOUS | Status: AC
  Filled 2020-06-08: qty 100

## 2020-06-08 MED ORDER — ACETAMINOPHEN 500 MG PO TABS
1000.0000 mg | ORAL_TABLET | ORAL | Status: AC
  Administered 2020-06-08: 1000 mg via ORAL

## 2020-06-08 MED ORDER — ACETAMINOPHEN 10 MG/ML IV SOLN
INTRAVENOUS | Status: AC
  Filled 2020-06-08: qty 100

## 2020-06-08 MED ORDER — ACETAMINOPHEN 325 MG PO TABS
650.0000 mg | ORAL_TABLET | ORAL | Status: DC | PRN
  Administered 2020-06-09 – 2020-06-10 (×4): 650 mg via ORAL
  Filled 2020-06-08 (×4): qty 2

## 2020-06-08 MED ORDER — SCOPOLAMINE 1 MG/3DAYS TD PT72: 1.0000 | MEDICATED_PATCH | Freq: Once | TRANSDERMAL | Status: DC

## 2020-06-08 MED ORDER — ORAL CARE MOUTH RINSE: 15.0000 mL | Freq: Once | OROMUCOSAL | Status: AC

## 2020-06-08 MED ORDER — KETOROLAC TROMETHAMINE 30 MG/ML IJ SOLN
30.0000 mg | Freq: Four times a day (QID) | INTRAMUSCULAR | Status: AC | PRN
  Administered 2020-06-08: 30 mg via INTRAVENOUS

## 2020-06-08 MED ORDER — OXYTOCIN-SODIUM CHLORIDE 30-0.9 UT/500ML-% IV SOLN
2.5000 [IU]/h | INTRAVENOUS | Status: AC
  Administered 2020-06-08: 2.5 [IU]/h via INTRAVENOUS
  Filled 2020-06-08: qty 500

## 2020-06-08 MED ORDER — DIBUCAINE (PERIANAL) 1 % EX OINT: 1.0000 "application " | TOPICAL_OINTMENT | CUTANEOUS | Status: DC | PRN

## 2020-06-08 MED ORDER — KETOROLAC TROMETHAMINE 30 MG/ML IJ SOLN: 30.0000 mg | Freq: Four times a day (QID) | INTRAMUSCULAR | Status: AC | PRN

## 2020-06-08 MED ORDER — ENOXAPARIN SODIUM 60 MG/0.6ML ~~LOC~~ SOLN
0.5000 mg/kg | SUBCUTANEOUS | Status: DC
  Administered 2020-06-09 – 2020-06-10 (×2): 45 mg via SUBCUTANEOUS
  Filled 2020-06-08 (×2): qty 0.6

## 2020-06-08 MED ORDER — OXYCODONE HCL 5 MG/5ML PO SOLN: 5.0000 mg | Freq: Once | ORAL | Status: DC | PRN

## 2020-06-08 MED ORDER — GABAPENTIN 300 MG PO CAPS
300.0000 mg | ORAL_CAPSULE | ORAL | Status: AC
  Administered 2020-06-08: 300 mg via ORAL

## 2020-06-08 MED ORDER — POVIDONE-IODINE 10 % EX SWAB: 2.0000 "application " | Freq: Once | CUTANEOUS | Status: DC

## 2020-06-08 MED ORDER — DIPHENHYDRAMINE HCL 25 MG PO CAPS: 25.0000 mg | ORAL_CAPSULE | ORAL | Status: DC | PRN

## 2020-06-08 MED ORDER — ACETAMINOPHEN 10 MG/ML IV SOLN: 1000.0000 mg | Freq: Once | INTRAVENOUS | Status: DC | PRN

## 2020-06-08 MED ORDER — NALBUPHINE HCL 10 MG/ML IJ SOLN
5.0000 mg | Freq: Once | INTRAMUSCULAR | Status: DC | PRN
Start: 2020-06-08 — End: 2020-06-10

## 2020-06-08 MED ORDER — NALOXONE HCL 4 MG/10ML IJ SOLN
1.0000 ug/kg/h | INTRAVENOUS | Status: DC | PRN
  Filled 2020-06-08: qty 5

## 2020-06-08 MED ORDER — METHYLERGONOVINE MALEATE 0.2 MG/ML IJ SOLN
INTRAMUSCULAR | Status: AC
  Filled 2020-06-08: qty 1

## 2020-06-08 MED ORDER — OXYCODONE HCL 5 MG PO TABS: 5.0000 mg | ORAL_TABLET | Freq: Once | ORAL | Status: DC | PRN

## 2020-06-08 MED ORDER — DIPHENHYDRAMINE HCL 50 MG/ML IJ SOLN: 12.5000 mg | INTRAMUSCULAR | Status: DC | PRN

## 2020-06-08 MED ORDER — SOD CITRATE-CITRIC ACID 500-334 MG/5ML PO SOLN: 30.0000 mL | Freq: Once | ORAL | Status: DC

## 2020-06-08 MED ORDER — ONDANSETRON HCL 4 MG/2ML IJ SOLN
INTRAMUSCULAR | Status: DC | PRN
  Administered 2020-06-08: 4 mg via INTRAVENOUS

## 2020-06-08 MED ORDER — ONDANSETRON HCL 4 MG/2ML IJ SOLN
4.0000 mg | Freq: Three times a day (TID) | INTRAMUSCULAR | Status: DC | PRN
  Administered 2020-06-08: 4 mg via INTRAVENOUS
  Filled 2020-06-08: qty 2

## 2020-06-08 MED ORDER — PHENYLEPHRINE HCL-NACL 20-0.9 MG/250ML-% IV SOLN
INTRAVENOUS | Status: DC | PRN
  Administered 2020-06-08: 60 ug/min via INTRAVENOUS

## 2020-06-08 MED ORDER — DIPHENHYDRAMINE HCL 25 MG PO CAPS: 25.0000 mg | ORAL_CAPSULE | Freq: Four times a day (QID) | ORAL | Status: DC | PRN

## 2020-06-08 MED ORDER — SCOPOLAMINE 1 MG/3DAYS TD PT72
MEDICATED_PATCH | TRANSDERMAL | Status: AC
  Filled 2020-06-08: qty 1

## 2020-06-08 MED ORDER — ACETAMINOPHEN 500 MG PO TABS
1000.0000 mg | ORAL_TABLET | Freq: Four times a day (QID) | ORAL | Status: AC
  Administered 2020-06-08 – 2020-06-09 (×4): 1000 mg via ORAL
  Filled 2020-06-08 (×4): qty 2

## 2020-06-08 MED ORDER — METHYLERGONOVINE MALEATE 0.2 MG/ML IJ SOLN
0.2000 mg | Freq: Once | INTRAMUSCULAR | Status: AC
  Administered 2020-06-08: 0.2 mg via INTRAMUSCULAR

## 2020-06-08 MED ORDER — FENTANYL CITRATE (PF) 100 MCG/2ML IJ SOLN: 25.0000 ug | INTRAMUSCULAR | Status: DC | PRN

## 2020-06-08 MED ORDER — SODIUM CHLORIDE 0.9% FLUSH
3.0000 mL | INTRAVENOUS | Status: DC | PRN
Start: 2020-06-08 — End: 2020-06-10

## 2020-06-08 MED ORDER — KETOROLAC TROMETHAMINE 30 MG/ML IJ SOLN: 30.0000 mg | Freq: Once | INTRAMUSCULAR | Status: DC

## 2020-06-08 MED ORDER — NALBUPHINE HCL 10 MG/ML IJ SOLN: 5.0000 mg | Freq: Once | INTRAMUSCULAR | Status: DC | PRN

## 2020-06-08 MED ORDER — CEFAZOLIN SODIUM-DEXTROSE 2-4 GM/100ML-% IV SOLN
2.0000 g | INTRAVENOUS | Status: AC
  Administered 2020-06-08: 2 g via INTRAVENOUS

## 2020-06-08 MED ORDER — FENTANYL CITRATE (PF) 100 MCG/2ML IJ SOLN
INTRAMUSCULAR | Status: AC
  Filled 2020-06-08: qty 2

## 2020-06-08 MED ORDER — SIMETHICONE 80 MG PO CHEW
80.0000 mg | CHEWABLE_TABLET | Freq: Three times a day (TID) | ORAL | Status: DC
  Administered 2020-06-08 – 2020-06-10 (×4): 80 mg via ORAL
  Filled 2020-06-08 (×4): qty 1

## 2020-06-08 MED ORDER — SOD CITRATE-CITRIC ACID 500-334 MG/5ML PO SOLN
30.0000 mL | ORAL | Status: AC
  Administered 2020-06-08: 30 mL via ORAL

## 2020-06-08 MED ORDER — AMISULPRIDE (ANTIEMETIC) 5 MG/2ML IV SOLN: 10.0000 mg | Freq: Once | INTRAVENOUS | Status: DC | PRN

## 2020-06-08 MED ORDER — TETANUS-DIPHTH-ACELL PERTUSSIS 5-2.5-18.5 LF-MCG/0.5 IM SUSY: 0.5000 mL | PREFILLED_SYRINGE | Freq: Once | INTRAMUSCULAR | Status: DC

## 2020-06-08 SURGICAL SUPPLY — 3 items
BENZOIN TINCTURE PRP APPL 2/3 (GAUZE/BANDAGES/DRESSINGS) ×2 IMPLANT
CLOSURE STERI STRIP 1/2 X4 (GAUZE/BANDAGES/DRESSINGS) ×2 IMPLANT
DRSG OPSITE POSTOP 4X10 (GAUZE/BANDAGES/DRESSINGS) ×2 IMPLANT

## 2020-06-08 NOTE — Discharge Summary (Addendum)
Postpartum Discharge Summary     Patient Name: Mckenzie Mckenzie Nelson DOB: 10-02-1990 MRN: [id number]  Date of admission: 06/08/2020 Delivery date:   Mckenzie, Mckenzie Nelson [[id number]]  06/08/2020    Mckenzie, Mckenzie Mckenzie Nelson [[id number]]  06/08/2020   Delivering provider:    Manson Allan Mckenzie Nelson [[id number]]  Mckenzie Mckenzie Nelson, Mckenzie Mckenzie Nelson, Mckenzie Mckenzie Nelson [[id number]]  Mckenzie Mckenzie Nelson, Mckenzie Mckenzie Nelson   Date of discharge: 06/10/2020  Admitting diagnosis: Status post cesarean delivery [Z98.891] Cesarean delivery delivered [O82] Intrauterine pregnancy: [redacted]w[redacted]d    Secondary diagnosis:  Active Problems:   Sickle cell trait in mother affecting pregnancy ([place]   Maternal iron deficiency anemia affecting pregnancy in third trimester, antepartum   Supervision of high-risk pregnancy   Dichorionic diamniotic twin gestation   Preexisting hypertension complicating pregnancy, antepartum   COVID-19 affecting pregnancy in second trimester   Group B Streptococcus carrier, +RV culture, currently pregnant   Cesarean delivery delivered   Status post cesarean delivery   Hepatomegaly  Additional problems: none    Discharge diagnosis: Term Pregnancy Delivered and CHTN                                              Post partum procedures:none Augmentation: N/A Complications: None  Hospital course: Sceduled C/S   30y.o. yo G3P3004 at 342w1das admitted to the hospital 06/08/2020 for scheduled cesarean section with the following indication:Malpresentation and Multifetal Gestation.Delivery details are as follows:  Membrane Rupture Time/Date:    DaKaydence, Menardhonecia [0[[id number]]9:28 AM    DaDeshante, Cassellhonecia [0[[id number]]9:29 AM  ,   DaDevyn, Griffinghonecia [0[[id number]]06/08/2020    DaVanecia, Limperthonecia [0[[id number]]06/08/2020    Delivery Method:   DaManson Allanhonecia [0[[id number]]C-Section, Low Transverse    DaTanesia, Butnerhonecia [0[[id number]]C-Section, Low Transverse   Details of operation can be  found in separate operative note.  Patient had an uncomplicated postpartum course.  She is ambulating, tolerating a regular diet, passing flatus, and urinating well. Patient is discharged home in stable condition on  06/10/20        Newborn Data: Birth date:   DaMysha, Peeler0[[id number]]06/08/2020    DaPorshia, Blizzardhonecia [0[[id number]]06/08/2020   Birth time:   DaDaenerys, Buttramhonecia [0[[id number]]9:28 AM    DaSantiago Mckenzie Nelson [0[[id number]]9:29 AM   Gender:   DaBayley, Yarboroughhonecia [0[[id number]]Female    DaMatia, Zeladahonecia [0[[id number]]Female   Living status:   DaAdah, [place]0[[id number]]Living    DaEvalee, Gerardhonecia [0[[id number]]Living   Apgars:   DaRhiana, Morashhonecia [0[[id number]]8 [address] [0[[id number]]8 [place],   DaKeyanah, Kozickihonecia [0[[id number]]9 [address] [0[[id number]]9   Weight:   DaWalterine, Amodeihonecia [0[[id number]]3140 g    DaHavyn, Ramohonecia [0[[id number]]3185 g      Magnesium Sulfate received: No BMZ received: No Rhophylac:N/A MMR:N/A T-DaP:Given prenatally Flu: No Transfusion:No  Physical exam  Vitals:   06/09/20 1032 06/09/20 1356 06/09/20 2122 06/10/20 0522  BP: 99/60 (!) 101/59 (!) 102/59 (!) 101/56  Pulse: (!) 59 (!) 58 71 63  Resp: _0 Temp: 98.5 F (36.9 C) 98.2  F (36.8 C) (!) 97.4 F (36.3 C) 98.4 F (36.9 C)  TempSrc: Oral Oral Oral   SpO2: 99% 98% 100% 100%  Weight:      Height:       General: alert, cooperative and no distress Lochia: appropriate Uterine Fundus: firm Incision: Healing well with no significant drainage, No significant erythema, Dressing is clean, dry, and intact DVT Evaluation: No evidence of DVT seen on physical exam. Labs: Lab Results  Component Value Date   WBC 5.9 06/09/2020   HGB 9.3 (L) 06/09/2020   HCT 26.6 (L) 06/09/2020   MCV 80.4 06/09/2020   PLT 206 06/09/2020   CMP Latest Ref Rng & Units 05/01/2020  Glucose 65 - 99  mg/dL 79  BUN 6 - 20 mg/dL 4(L)  Creatinine 0.57 - 1.00 mg/dL 0.52(L)  Sodium 134 - 144 mmol/L 137  Potassium 3.5 - 5.2 mmol/L 3.6  Chloride 96 - 106 mmol/L 104  CO2 20 - 29 mmol/L 17(L)  Calcium 8.7 - 10.2 mg/dL 8.6(L)  Total Protein 6.0 - 8.5 g/dL 6.6  Total Bilirubin 0.0 - 1.2 mg/dL 0.3  Alkaline Phos 44 - 121 IU/L 194(H)  AST 0 - 40 IU/L 11  ALT 0 - 32 IU/L 6   Edinburgh Score: Edinburgh Postnatal Depression Scale Screening Tool 06/08/2020  I have been able to laugh and see the funny side of things. (No Data)  I have looked forward with enjoyment to things. -  I have blamed myself unnecessarily when things went wrong. -  I have been anxious or worried for no good reason. -  I have felt scared or panicky for no good reason. -  Things have been getting on top of me. -  I have been so unhappy that I have had difficulty sleeping. -  I have felt sad or miserable. -  I have been so unhappy that I have been crying. -  The thought of harming myself has occurred to me. Mckenzie Mckenzie Nelson Postnatal Depression Scale Total -     After visit meds:  Allergies as of 06/10/2020   No Known Allergies     Medication List    STOP taking these medications   Accu-Chek Nano SmartView w/Device Kit   Accu-Chek Softclix Lancets lancets   aspirin EC 81 MG tablet   glucose blood test strip   multivitamin-prenatal 27-0.8 MG Tabs tablet   NIFEdipine 60 MG 24 hr tablet Commonly known as: PROCARDIA XL/NIFEDICAL XL     TAKE these medications   ferrous sulfate 325 (65 FE) MG tablet Take 1 tablet (325 mg total) by mouth every other day.   ibuprofen 800 MG tablet Commonly known as: ADVIL Take 1 tablet (800 mg total) by mouth every 6 (six) hours.   norethindrone 0.35 MG tablet Commonly known as: MICRONOR Take 1 tablet (0.35 mg total) by mouth daily.   oxyCODONE 5 MG immediate release tablet Commonly known as: Oxy IR/ROXICODONE Take 1 tablet (5 mg total) by mouth every 4 (four) hours as  needed for moderate pain.        Discharge home in stable condition Infant Feeding: Breast Infant Disposition:home with mother Discharge instruction: per After Visit Summary and Postpartum booklet. Activity: Advance as tolerated. Pelvic rest for 6 weeks.  Diet: routine diet Future Appointments: Future Appointments  Date Time Provider [place]  06/17/2020  2:30 PM [facility] NURSE [facility]  07/15/2020  3:00 PM Caren Macadam, MD [facility]   Follow up Visit:  Follow-up  Information    Center for [employer] at [facility]. Schedule an appointment as soon as possible for a visit in 1 week(s).   Specialty: Obstetrics and Gynecology Why: For BP check Contact information: [place] [phone]             Message sent to [facility] 06/08/20 by Sylvester Harder.   Please schedule this patient for a In person postpartum visit in 6 weeks with the following provider: Any provider. Additional Postpartum F/U:Incision check 1 week and BP check 1 week  High risk pregnancy complicated by: HTN Delivery mode:     Jael, [place] [[id number]]  C-Section, Low Transverse    [place], [place] Mitzy [[id number]]  C-Section, Low Transverse   Anticipated Birth Control:  POPs, rx sent to pharmacy at discharge.    06/10/2020 Hansel Feinstein, CNM

## 2020-06-08 NOTE — Discharge Instructions (Signed)
-take tylenol 1000 mg every 6 hours as needed for pain, alternate with ibuprofen 600 mg every 6 hours -take oxycodone as needed if tylenol and ibuprofen aren't working -drink plenty of water to help with breastfeeding -continue prenatal vitamins while you are breastfeeding -take iron pills every other day with vitamin c, this will help healing as well as breast feeding -think about birth control options-->bedisider.org is a great website! You can get any form of birth control from the health department for free   Postpartum Care After Cesarean Delivery This sheet gives you information about how to care for yourself from the time you deliver your baby to up to 6-12 weeks after delivery (postpartum period). Your health care provider may also give you more specific instructions. If you have problems or questions, contact your health care provider. Follow these instructions at home: Medicines  Take over-the-counter and prescription medicines only as told by your health care provider.  If you were prescribed an antibiotic medicine, take it as told by your health care provider. Do not stop taking the antibiotic even if you start to feel better.  Ask your health care provider if the medicine prescribed to you: ? Requires you to avoid driving or using heavy machinery. ? Can cause constipation. You may need to take actions to prevent or treat constipation, such as:  Drink enough fluid to keep your urine pale yellow.  Take over-the-counter or prescription medicines.  Eat foods that are high in fiber, such as beans, whole grains, and fresh fruits and vegetables.  Limit foods that are high in fat and processed sugars, such as fried or sweet foods. Activity  Gradually return to your normal activities as told by your health care provider.  Avoid activities that take a lot of effort and energy (are strenuous) until approved by your health care provider. Walking at a slow to moderate pace is usually  safe. Ask your health care provider what activities are safe for you. ? Do not lift anything that is heavier than your baby or 10 lb (4.5 kg) as told by your health care provider. ? Do not vacuum, climb stairs, or drive a car for as long as told by your health care provider.  If possible, have someone help you at home until you are able to do your usual activities yourself.  Rest as much as possible. Try to rest or take naps while your baby is sleeping. Vaginal bleeding  It is normal to have vaginal bleeding (lochia) after delivery. Wear a sanitary pad to absorb vaginal bleeding and discharge. ? During the first week after delivery, the amount and appearance of lochia is often similar to a menstrual period. ? Over the next few weeks, it will gradually decrease to a dry, yellow-brown discharge. ? For most women, lochia stops completely by 4-6 weeks after delivery. Vaginal bleeding can vary from woman to woman.  Change your sanitary pads frequently. Watch for any changes in your flow, such as: ? A sudden increase in volume. ? A change in color. ? Large blood clots.  If you pass a blood clot, save it and call your health care provider to discuss. Do not flush blood clots down the toilet before you get instructions from your health care provider.  Do not use tampons or douches until your health care provider says this is safe.  If you are not breastfeeding, your period should return 6-8 weeks after delivery. If you are breastfeeding, your period may return anytime between 8  weeks after delivery and the time that you stop breastfeeding. Perineal care  If your C-section (Cesarean section) was unplanned, and you were allowed to labor and push before delivery, you may have pain, swelling, and discomfort of the tissue between your vaginal opening and your anus (perineum). You may also have an incision in the tissue (episiotomy) or the tissue may have torn during delivery. Follow these instructions  as told by your health care provider: ? Keep your perineum clean and dry as told by your health care provider. Use medicated pads and pain-relieving sprays and creams as directed. ? If you have an episiotomy or vaginal tear, check the area every day for signs of infection. Check for:  Redness, swelling, or pain.  Fluid or blood.  Warmth.  Pus or a bad smell. ? You may be given a squirt bottle to use instead of wiping to clean the perineum area after you go to the bathroom. As you start healing, you may use the squirt bottle before wiping yourself. Make sure to wipe gently. ? To relieve pain caused by an episiotomy, vaginal tear, or hemorrhoids, try taking a warm sitz bath 2-3 times a day. A sitz bath is a warm water bath that is taken while you are sitting down. The water should only come up to your hips and should cover your buttocks.   Breast care  Within the first few days after delivery, your breasts may feel heavy, full, and uncomfortable (breast engorgement). You may also have milk leaking from your breasts. Your health care provider can suggest ways to help relieve breast discomfort. Breast engorgement should go away within a few days.  If you are breastfeeding: ? Wear a bra that supports your breasts and fits you well. ? Keep your nipples clean and dry. Apply creams and ointments as told by your health care provider. ? You may need to use breast pads to absorb milk leakage. ? You may have uterine contractions every time you breastfeed for several weeks after delivery. Uterine contractions help your uterus return to its normal size. ? If you have any problems with breastfeeding, work with your health care provider or a Science writer.  If you are not breastfeeding: ? Avoid touching your breasts as this can make your breasts produce more milk. ? Wear a well-fitting bra and use cold packs to help with swelling. ? Do not squeeze out (express) milk. This causes you to make more  milk. Intimacy and sexuality  Ask your health care provider when you can engage in sexual activity. This may depend on your: ? Risk of infection. ? Healing rate. ? Comfort and desire to engage in sexual activity.  You are able to get pregnant after delivery, even if you have not had your period. If desired, talk with your health care provider about methods of family planning or birth control (contraception). Lifestyle  Do not use any products that contain nicotine or tobacco, such as cigarettes, e-cigarettes, and chewing tobacco. If you need help quitting, ask your health care provider.  Do not drink alcohol, especially if you are breastfeeding. Eating and drinking  Drink enough fluid to keep your urine pale yellow.  Eat high-fiber foods every day. These may help prevent or relieve constipation. High-fiber foods include: ? Whole grain cereals and breads. ? Brown rice. ? Beans. ? Fresh fruits and vegetables.  Take your prenatal vitamins until your postpartum checkup or until your health care provider tells you it is okay to stop.  General instructions  Keep all follow-up visits for you and your baby as told by your health care provider. Most women visit their health care provider for a postpartum checkup within the first 3-6 weeks after delivery. Contact a health care provider if you:  Feel unable to cope with the changes that a new baby brings to your life, and these feelings do not go away.  Feel unusually sad or worried.  Have breasts that are painful, hard, or turn red.  Have a fever.  Have trouble holding urine or keeping urine from leaking.  Have little or no interest in activities you used to enjoy.  Have not breastfed at all and you have not had a menstrual period for 12 weeks after delivery.  Have stopped breastfeeding and you have not had a menstrual period for 12 weeks after you stopped breastfeeding.  Have questions about caring for yourself or your  baby.  Pass a blood clot from your vagina. Get help right away if you:  Have chest pain.  Have difficulty breathing.  Have sudden, severe leg pain.  Have severe pain or cramping in your abdomen.  Bleed from your vagina so much that you fill more than one sanitary pad in one hour. Bleeding should not be heavier than your heaviest period.  Develop a severe headache.  Faint.  Have blurred vision or spots in your vision.  Have a bad-smelling vaginal discharge.  Have thoughts about hurting yourself or your baby. If you ever feel like you may hurt yourself or others, or have thoughts about taking your own life, get help right away. You can go to your nearest emergency department or call:  Your local emergency services (911 in the [place]  A suicide crisis helpline, such as the [facility] at 775-764-4624. This is open 24 hours a day. Summary  The period of time from when you deliver your baby to up to 6-12 weeks after delivery is called the postpartum period.  Gradually return to your normal activities as told by your health care provider.  Keep all follow-up visits for you and your baby as told by your health care provider. This information is not intended to replace advice given to you by your health care provider. Make sure you discuss any questions you have with your health care provider. Document Revised: 11/01/2017 Document Reviewed: 11/01/2017 Elsevier Patient Education  [place].

## 2020-06-08 NOTE — Anesthesia Postprocedure Evaluation (Signed)
Anesthesia Post Note  Patient: Mckenzie Nelson  Procedure(s) Performed: CESAREAN SECTION MULTI-GESTATIONAL (N/A )     Patient location during evaluation: Mother Baby Anesthesia Type: Spinal Level of consciousness: oriented and awake and alert Pain management: pain level controlled Vital Signs Assessment: post-procedure vital signs reviewed and stable Respiratory status: spontaneous breathing and respiratory function stable Cardiovascular status: blood pressure returned to baseline and stable Postop Assessment: no headache, no backache, no apparent nausea or vomiting and patient able to bend at knees Anesthetic complications: no   No complications documented.  Last Vitals:  Vitals:   06/08/20 1130 06/08/20 1149  BP: 130/82 116/72  Pulse: 71 61  Resp: 10 18  Temp:  (!) 36.3 C  SpO2: 98% 98%    Last Pain:  Vitals:   06/08/20 1200  PainSc: 0-No pain   Pain Goal:                   [name]

## 2020-06-08 NOTE — Lactation Note (Signed)
This note was copied from a baby's chart. Lactation Consultation Note  Patient Name: Mckenzie Nelson VFIEP'P Date: 06/08/2020 Reason for consult: Initial assessment;Early term 37-38.6wks Age:30 hours  Baby girl A:  LC and [place] student to room for initial consultation. Baby girl A was feeding at the right breast in football hold at time of visit. Mom had great positioning, Baby had great suckling, audible swallows and good latch.   Baby girl B:  Baby is sleeping in basinet at the time of this consult. Mother reports baby  breastfed for ~[facility].   Mother reports having experience with breastfeeding, and hand expression. Mom reported that she would like to pump; [place] student set up DEBP, went over education- cleaning, frequency and storage. ~4 ml of colostrum collected in one 15 minute session. Mom's flange is size 2mm. Mother hand expressed into spoon ~20mL and fed baby.   [facility] student encouraged mom to pump after breastfeeding each baby. Talked about tandem feeding. Went over Neurosurgeon; hunger cues, cluster feeding, feeding frequency, size of baby's stomach, and sleepiness for early term infants.   Maternal Data Has patient been taught Hand Expression?: Yes Does the patient have breastfeeding experience prior to this delivery?: Yes How long did the patient breastfeed?: 7 months 1st child, less than seven months 2nd child  Feeding Mother's Current Feeding Choice: Breast Milk  LATCH Score Latch: Grasps breast easily, tongue down, lips flanged, rhythmical sucking.  Audible Swallowing: Spontaneous and intermittent  Type of Nipple: Everted at rest and after stimulation  Comfort (Breast/Nipple): Soft / non-tender  Hold (Positioning): No assistance needed to correctly position infant at breast.  LATCH Score: 10   Lactation Tools Discussed/Used Tools: Pump Breast pump type: Double-Electric Breast Pump Pump Education: Setup, frequency, and  cleaning;Milk Storage Reason for Pumping: mothers choice Pumped volume: 4 mL  Interventions Interventions: Breast feeding basics reviewed;Skin to skin;Breast massage;Hand express;Breast compression;Expressed milk;DEBP;Education  Discharge Pump: DEBP WIC Program: No  Consult Status Consult Status: Follow-up Date: 06/09/20 Follow-up type: In-patient    Linels A Higuera Ancidey 06/08/2020, 5:50 PM

## 2020-06-08 NOTE — Anesthesia Preprocedure Evaluation (Addendum)
Anesthesia Evaluation  Patient identified by MRN, date of birth, ID band Patient awake    Reviewed: Allergy & Precautions, NPO status , Patient's Chart, lab work & pertinent test results  Airway Mallampati: II  TM Distance: >3 FB Neck ROM: Full    Dental no notable dental hx.    Pulmonary neg pulmonary ROS, former smoker,    Pulmonary exam normal breath sounds clear to auscultation       Cardiovascular hypertension, Normal cardiovascular exam Rhythm:Regular Rate:Normal     Neuro/Psych negative neurological ROS  negative psych ROS   GI/Hepatic negative GI ROS, Neg liver ROS,   Endo/Other  Morbid obesity  Renal/GU negative Renal ROS  negative genitourinary   Musculoskeletal negative musculoskeletal ROS (+)   Abdominal   Peds negative pediatric ROS (+)  Hematology negative hematology ROS (+) Sickle cell trait and anemia ,   Anesthesia Other Findings   Reproductive/Obstetrics (+) Pregnancy                           Anesthesia Physical Anesthesia Plan  ASA: III  Anesthesia Plan: Spinal   Post-op Pain Management:    Induction:   PONV Risk Score and Plan: 2 and Ondansetron, Scopolamine patch - Pre-op and Treatment may vary due to age or medical condition  Airway Management Planned: Natural Airway  Additional Equipment:   Intra-op Plan:   Post-operative Plan:   Informed Consent: I have reviewed the patients History and Physical, chart, labs and discussed the procedure including the risks, benefits and alternatives for the proposed anesthesia with the patient or authorized representative who has indicated his/her understanding and acceptance.       Plan Discussed with: CRNA, Anesthesiologist and Surgeon  Anesthesia Plan Comments:        Anesthesia Quick Evaluation

## 2020-06-08 NOTE — Op Note (Signed)
Operative Note   Patient: Mckenzie Nelson  Date of Procedure: 06/08/2020  Procedure: Primary Low Transverse Cesarean   Indications: malpresentation: breech frank, transverse and multiple gestation: twins dichorionic/diamniotic  Pre-operative Diagnosis: breech/breech twins/ primary c/section.   Post-operative Diagnosis: Same  TOLAC Candidate: Yes   Surgeon: Surgeon(s) and Role:    * Clarnce Flock, MD - Primary    * Arrie Senate, MD - Fellow    * Sloan Leiter, MD  Assistants: none  An experienced assistant was required given the standard of surgical care given the complexity of the case.  This assistant was needed for exposure, dissection, suctioning, retraction, instrument exchange, assisting with delivery with administration of fundal pressure, and for overall help during the procedure.   Anesthesia: spinal  Anesthesiologist: [name], MD   Antibiotics: Cefazolin   Estimated Blood Loss: 700 ml   Total IV Fluids: 1000 ml  Urine Output: 75 cc OF clear urine  Specimens: placentas to pathology   Complications: no complications   Indications: Mckenzie Nelson is a 30 y.o. [id number] with an IUP [redacted]w[redacted]d presenting for scheduled secondary to the indications listed above.  Findings: Viable female infant in breech presentation, no nuchal cord present. Apgars 8 and 9.  Clear amniotic fluid. Densely adherent placenta, three vessel cord. Viable female infant in transverse presentation, body cord present. Densely adherent placenta, 3 vessel cord.  Normal uterus, Normal bilateral fallopian tubes, Normal bilateral ovaries. Enlarged liver, liver edge present after uterine massage. Normal appearing gallbladder.  Procedure Details: A Time Out was held and the above information confirmed. The patient received intravenous antibiotics and had sequential compression devices applied to her lower extremities preoperatively. The patient was taken back to the operative suite where  spinal anesthesia was administered. After induction of anesthesia, the patient was draped and prepped in the usual sterile manner and placed in a dorsal supine position with a leftward tilt. A low transverse was made with scalpel and carried down through the subcutaneous tissue to the fascia. Fascial incision was made and extended transversely. The fascia was separated from the underlying rectus tissue superiorly and inferiorly. The rectus muscles were separated in the midline bluntly and the peritoneum was entered bluntly. An Alexis retractor was placed to aid in visualization of the uterus. A bladder flap was not developed. A low transverse incision was made. Twin A infant was successfully delivered from breech presentation, the umbilical cord was clamped immediately. Cord ph was not sent. Twin B infant was successfully delivered from transverse presentation, the umbilical cord was clamped immediately. Cord ph was not sent. Cord blood was obtained for evaluation. Umbilical cord of twin B was clamped with an umbilical clamp to mark. The placenta was removed Intact and appeared normal. The uterine incision was closed with running locked sutures of 0-Monocryl, and then a second imbricating layer was also placed with 0-Monocryl. Overall, excellent hemostasis was noted. The abdomen and the pelvis were cleared of all clot and debris and the Ubaldo Glassing was removed. Hemostasis was confirmed on all surfaces.  The peritoneum was reapproximated using 2-0 vicryl . The fascia was then closed using 0 Vicryl in a running fashion.The skin was closed with a 4-0 vicryl subcuticular stitch. The patient tolerated the procedure well. Sponge, lap, instrument and needle counts were correct x 2. She was taken to the recovery room in stable condition.  Disposition: PACU - hemodynamically stable.    Signed: Arrie Senate, MD, MPH [facility])

## 2020-06-08 NOTE — Transfer of Care (Signed)
Immediate Anesthesia Transfer of Care Note  Patient: Mckenzie Nelson  Procedure(s) Performed: CESAREAN SECTION MULTI-GESTATIONAL (N/A )  Patient Location: PACU  Anesthesia Type:Spinal  Level of Consciousness: awake, alert  and oriented  Airway & Oxygen Therapy: Patient Spontanous Breathing  Post-op Assessment: Report given to RN and Post -op Vital signs reviewed and stable  Post vital signs: Reviewed and stable  Last Vitals:  Vitals Value Taken Time  BP 112/76 06/08/20 1015  Temp 36.6 C 06/08/20 1010  Pulse 65 06/08/20 1021  Resp 16 06/08/20 1021  SpO2 99 % 06/08/20 1021  Vitals shown include unvalidated device data.  Last Pain:  Vitals:   06/08/20 1015  PainSc: 0-No pain         Complications: No complications documented.

## 2020-06-08 NOTE — H&P (Signed)
OBSTETRIC ADMISSION HISTORY AND PHYSICAL  Mckenzie Nelson is a 30 y.o. female G3P2002 with IUP at 73w1dby 7 wk u/s presenting for scheduled pLTCS-di/di twins, twin A breech presentation. She reports +FMs, No LOF, no VB, no blurry vision, headaches or peripheral edema, and RUQ pain.  She plans on breast feeding. She request POPs for birth control. [facility]  Dating: By 7Myrtice Lauthu/s --->  Estimated Date of Delivery: 06/21/20  Sono:    05/21/20@[redacted]w[redacted]d  Twin A: CWD, normal anatomy, breech presentation, anterior placental lie, 2968g, 76% EFW CWD, normal anatomy, breech presentation, anterior placental lie, 2885g, 68% EFW   Prenatal History/Complications:  Di/di twins-malpresentation Iron deficiency anemia (s/p feraheme x3, hgb 06/05/20 11.6) cHTN (nifidepine XL 375mqd) GBS positive Sickle cell trait (FOB not tested)  Past Medical History: Past Medical History:  Diagnosis Date  . Anemia   . Cellulitis and abscess of buttock 03/12/2013  . Pilonidal cyst   . [place]    Past Surgical History: Past Surgical History:  Procedure Laterality Date  . NO PAST SURGERIES      Obstetrical History: OB History    Gravida  3   Para  2   Term  2   Preterm  0   AB  0   Living  2     SAB  0   IAB  0   Ectopic  0   Multiple  0   Live Births  2           Social History Social History   Socioeconomic History  . Marital status: Married    Spouse name: Not on file  . Number of children: Not on file  . Years of education: Not on file  . Highest education level: Not on file  Occupational History  . Occupation: [employer]  Tobacco Use  . Smoking status: Former SmResearch scientist (life sciences). Smokeless tobacco: Never Used  Vaping Use  . Vaping Use: Never used  Substance and Sexual Activity  . Alcohol use: No  . Drug use: No  . Sexual activity: Yes    Partners: Male    Birth control/protection: None  Other Topics Concern  . Not on file   Social History Narrative  . Not on file   Social Determinants of Health   Financial Resource Strain: Not on file  Food Insecurity: Not on file  Transportation Needs: Not on file  Physical Activity: Not on file  Stress: Not on file  Social Connections: Not on file    Family History: Family History  Problem Relation Age of Onset  . Diabetes Maternal Grandmother   . Stroke Maternal Grandmother   . Emphysema Maternal Grandfather   . Heart disease Maternal Grandfather     Allergies: No Known Allergies  Medications Prior to Admission  Medication Sig Dispense Refill Last Dose  . aspirin EC 81 MG tablet Take 1 tablet (81 mg total) by mouth daily. Take after 12 weeks for prevention of preeclampsia later in pregnancy 300 tablet 2 Past Week at Unknown time  . NIFEdipine (PROCARDIA XL/NIFEDICAL XL) 60 MG 24 hr tablet Take 1 tablet (60 mg total) by mouth daily. (Patient taking differently: Take 30 mg by mouth daily.) 30 tablet 2   . Prenatal Vit-Fe Fumarate-FA (MULTIVITAMIN-PRENATAL) 27-0.8 MG TABS tablet Take 1 tablet by mouth daily at 12 noon.   06/07/2020 at Unknown time  . Accu-Chek Softclix Lancets lancets 1 each by Other route  4 (four) times daily. Use as instructed. Check blood glucose fasting, and 1-2hrs after eating 100 each 2   . Blood Glucose Monitoring Suppl (ACCU-CHEK NANO SMARTVIEW) w/Device KIT 1 kit by Subdermal route as directed. Check blood sugars for fasting, and two hours after breakfast, lunch and dinner (4 checks daily) 1 kit 0   . ferrous sulfate (FERROUSUL) 325 (65 FE) MG tablet Take 1 tablet (325 mg total) by mouth every other day. (Patient not taking: Reported on 06/01/2020) 30 tablet 3 Not Taking at Unknown time  . glucose blood test strip Use as instructed 100 each 2      Review of Systems   All systems reviewed and negative except as stated in HPI  Last menstrual period 07/27/2019, unknown if currently breastfeeding. General appearance: alert, cooperative and  no distress Lungs: normal respiratory effort Heart: regular rate and rhythm Abdomen: soft, non-tender; gravid Pelvic: not indicated Extremities: Homans sign is negative, no sign of DVT Presentation: breech/transverse      Prenatal labs: ABO, Rh: --/--/B POS (03/11 0950) Antibody: NEG (03/11 0950) Rubella: 27.40 (09/27 1542) RPR: NON REACTIVE (03/11 1000)  HBsAg: Negative (09/27 1542)  HIV: Non Reactive (02/04 1024)  GBS: Positive/-- (03/03 1409)  2 hr Glucola refused, a1c well controlled Genetic screening declined Anatomy US di-di twin pregnancy, twin A bilateral choroid plexus cysts  Prenatal Transfer Tool  Maternal Diabetes: No Genetic Screening: Declined Maternal Ultrasounds/Referrals: Isolated choroid plexus cyst Fetal Ultrasounds or other Referrals:  Referred to Materal Fetal Medicine  Maternal Substance Abuse:  No Significant Maternal Medications:  Meds include: Other: nifedipine  Significant Maternal Lab Results: Group B Strep positive  No results found for this or any previous visit (from the past 24 hour(s)).  Patient Active Problem List   Diagnosis Date Noted  . Group B Streptococcus carrier, +RV culture, currently pregnant 06/01/2020  . COVID-19 affecting pregnancy in second trimester 01/09/2020  . Preexisting hypertension complicating pregnancy, antepartum 12/23/2019  . Dichorionic diamniotic twin gestation 11/06/2019  . Supervision of high-risk pregnancy 09/08/2019  . Maternal iron deficiency anemia affecting pregnancy in third trimester, antepartum 09/25/2017  . Sickle cell trait in mother affecting pregnancy ([place]) 02/11/2016    Assessment/Plan:  Mckenzie Nelson is a 30 y.o. G3P2002 at 5w1dhere for scheduled pLTCS-di/di twins, malpresentation.  #pLTCS  The risks of cesarean section were discussed with the patient including but were not limited to: bleeding which may require transfusion or reoperation; infection which may require antibiotics; injury to  bowel, bladder, ureters or other surrounding organs; injury to the fetus; need for additional procedures including hysterectomy in the event of a life-threatening hemorrhage; placental abnormalities wth subsequent pregnancies, incisional problems, thromboembolic phenomenon and other postoperative/anesthesia complications.  The patient concurred with the proposed plan, giving informed written consent for the procedure.  Patient has been NPO since 0000 she will remain NPO for procedure. Anesthesia and OR aware.  Preoperative prophylactic antibiotics and SCDs ordered on call to the OR.  To OR when ready.  #Pain: spinal #ID: GBS +, given route of delivery intra-op ancef 2g #MOF: breast #MOC: POPs #Circ: pending genders  #cHTN: nifedipine XL 30 mg daily (prescribed 60 mg daily), asymptomatic. preE labs last normal.  #Iron deficiency anemia: outpatient feraheme x3. Last hgb 11.6. Asymptomatic.  AArrie Senate MD  06/08/2020, 7:42 AM

## 2020-06-09 ENCOUNTER — Inpatient Hospital Stay: Payer: Medicaid Other | Admitting: Family Medicine

## 2020-06-09 LAB — CBC
HCT: 26.6 % — ABNORMAL LOW (ref 36.0–46.0)
Hemoglobin: 9.3 g/dL — ABNORMAL LOW (ref 12.0–15.0)
MCH: 28.1 pg (ref 26.0–34.0)
MCHC: 35 g/dL (ref 30.0–36.0)
MCV: 80.4 fL (ref 80.0–100.0)
Platelets: 206 10*3/uL (ref 150–400)
RBC: 3.31 MIL/uL — ABNORMAL LOW (ref 3.87–5.11)
RDW: 23.5 % — ABNORMAL HIGH (ref 11.5–15.5)
WBC: 5.9 10*3/uL (ref 4.0–10.5)
nRBC: 0 % (ref 0.0–0.2)

## 2020-06-09 MED ORDER — FERROUS SULFATE 325 (65 FE) MG PO TABS
325.0000 mg | ORAL_TABLET | Freq: Every day | ORAL | Status: DC
  Administered 2020-06-09 – 2020-06-10 (×2): 325 mg via ORAL
  Filled 2020-06-09 (×2): qty 1

## 2020-06-09 NOTE — Progress Notes (Signed)
Called by patient's nurse regarding patient's low blood pressures with a normal to low heart rate.  Patient currently prescribed 30 mg Procardia daily.  Patient reports that she just started on blood pressure medications late in her second trimester of pregnancy.  She has never been on blood pressure medications prior to this.  She denies any dizziness upon standing, lightheadedness.  She reports her bleeding is well controlled and has "almost stopped".  When I went into the room she had just left the bathroom and reported that she voided as well as had a bowel movement.  We are going to hold her blood pressure medications at this time and continue to monitor her BPs.  She will be scheduled for 1 week blood pressure check after discharge.  She hopes to discharge tomorrow.

## 2020-06-09 NOTE — Progress Notes (Signed)
Subjective: Postpartum Day 1: Cesarean Delivery Patient reports incisional pain, tolerating PO, + BM and no problems voiding.    Objective: Vital signs in last 24 hours: Temp:  [97.4 F (36.3 C)-99.2 F (37.3 C)] 97.7 F (36.5 C) (03/15 0230) Pulse Rate:  [57-83] 57 (03/15 0230) Resp:  [10-19] 18 (03/15 0230) BP: (98-132)/(56-84) 98/56 (03/15 0230) SpO2:  [96 %-100 %] 100 % (03/15 0230) Weight:  [90.3 kg] 90.3 kg (03/14 0757)  Physical Exam:  General: alert, cooperative and no distress Lochia: appropriate Uterine Fundus: firm Incision: healing well, no significant drainage, no dehiscence, no significant erythema DVT Evaluation: No evidence of DVT seen on physical exam.  Recent Labs    06/09/20 0458  HGB 9.3*  HCT 26.6*    Assessment/Plan: Status post Cesarean section. Doing well postoperatively.  Continue current care. Will start PO iron. Unsure of contraception and counseled at length on options.   Wende Mott CNM 06/09/2020, 6:40 AM

## 2020-06-09 NOTE — Lactation Note (Signed)
This note was copied from a baby's chart. Lactation Consultation Note  Patient Name: Mckenzie Nelson BTYOM'A Date: 06/09/2020 Reason for consult: Follow-up assessment;Early term 37-38.6wks Age:30 hours Mom bf on arrival.  Assisted with repositioning her to get a deeper latch.  Moms nipple misshappen past breastfeeding.  Still misshapen past breastfeeding with repositioning .  Urged hand expression and spoon feeding and pumping past breastfeeding as much as possible.  Infant with adequate voids/stools.  Urged hand express  Rub expressed mothers milk on nipples/air dry.  Call lactation as needed. Maternal Data    Feeding Mother's Current Feeding Choice: Breast Milk  LATCH Score Latch: Grasps breast easily, tongue down, lips flanged, rhythmical sucking.  Audible Swallowing: A few with stimulation  Type of Nipple: Everted at rest and after stimulation  Comfort (Breast/Nipple): Soft / non-tender  Hold (Positioning): Assistance needed to correctly position infant at breast and maintain latch.  LATCH Score: 8   Lactation Tools Discussed/Used Flange Size: 27 (observed moms nipples post bf/urged 27 mm flanges) Pumping frequency:  (mom has not pumped past bf today)  Interventions Interventions: Assisted with latch;Breast feeding basics reviewed;Hand express  Discharge    Consult Status Consult Status: Follow-up Date: 06/10/20 Follow-up type: In-patient    [facility] Caul 06/09/2020, 8:48 PM

## 2020-06-09 NOTE — Progress Notes (Signed)
Spoke to Dr. Caron Presume (Resident on call) about Procardia 30mg  QDaily ... BP's have been 90's/60's, HR=59.  Holding this medication for now.  Pt denies feeling lightheaded/dizziness.  Is ambulating independently in room.  Has voided twice after Foley catheter was d/c.

## 2020-06-10 LAB — TYPE AND SCREEN
ABO/RH(D): B POS
Antibody Screen: NEGATIVE
Unit division: 0
Unit division: 0

## 2020-06-10 LAB — BPAM RBC
Blood Product Expiration Date: 202204102359
Blood Product Expiration Date: 202204102359
Unit Type and Rh: 7300
Unit Type and Rh: 7300

## 2020-06-10 MED ORDER — FERROUS SULFATE 325 (65 FE) MG PO TABS: 325.0000 mg | ORAL_TABLET | ORAL | 3 refills | Status: AC

## 2020-06-10 MED ORDER — OXYCODONE HCL 5 MG PO TABS: 5.0000 mg | ORAL_TABLET | ORAL | 0 refills | Status: AC | PRN

## 2020-06-10 MED ORDER — NORETHINDRONE 0.35 MG PO TABS: 1.0000 | ORAL_TABLET | Freq: Every day | ORAL | 11 refills | Status: AC

## 2020-06-10 MED ORDER — IBUPROFEN 800 MG PO TABS
800.0000 mg | ORAL_TABLET | Freq: Four times a day (QID) | ORAL | 0 refills | Status: AC
Start: 2020-06-10 — End: ?

## 2020-06-10 NOTE — Progress Notes (Signed)
POSTPARTUM PROGRESS NOTE  POD #2  Subjective:  Mckenzie Nelson is a 30 y.o. [id number] s/p pLTCS for malpresentation of di/di twins at [redacted]w[redacted]d.  She reports she is doing well. No acute events overnight. She reports she is doing well. She denies any problems with ambulating, voiding or po intake. Denies nausea or vomiting. She has passed flatus. Pain is well controlled.  Lochia is appropriate.  Objective: Blood pressure (!) 101/56, pulse 63, temperature 98.4 F (36.9 C), resp. rate 18, height 5\' 4"  (1.626 m), weight 90.3 kg, last menstrual period 07/27/2019, SpO2 100 %, unknown if currently breastfeeding.  Physical Exam:  General: alert, cooperative and no distress Chest: no respiratory distress Heart:regular rate, distal pulses intact Abdomen: soft, nontender,  Uterine Fundus: firm, appropriately tender DVT Evaluation: No calf swelling or tenderness Extremities: 0+ edema Skin: warm, dry; incision clean/dry/intact w/ honeycomb and pressure dressing in place  Recent Labs    06/09/20 0458  HGB 9.3*  HCT 26.6*    Assessment/Plan: Mckenzie Nelson is a 30 y.o. [id number] s/p pLTCS at [redacted]w[redacted]d for malpresentation of di/di twins.  POD#2 - Doing welll; pain well controlled.  Routine postpartum care  OOB, ambulated  Lovenox for VTE prophylaxis BP: Still low (101/56 @ 0522) and recommend continue holding Procardia. Has a home BP cuff and will continue to check pressures before f/u visit in 1 week. Has been counseled about s/s of pre-eclampsia and to call provider with any concerns. Anemia: hGB from 3/15 is 9.3 and asymptomatic Start daily po ferrous sulfate Contraception: POPs Feeding: breast  Dispo: Pt is ready for discharge.   LOS: 2 days   Genelle Gather, MS3 06/10/2020, 7:44 AM

## 2020-06-10 NOTE — Lactation Note (Addendum)
This note was copied from a baby's chart. Lactation Consultation Note  Patient Name: Mckenzie Nelson VOHKG'O Date: 06/10/2020 Reason for consult: Follow-up assessment Age:30 hours   P3, Mother with DiDi Twin girls, Baby A breastfeeding when I arrived . Infant in cradle hold. Infant at the end of the feeding. Observed a few sucks no swallows. Observed that mothers nipple was completely flattened at the bottom and a ridge across the top.   Mother taught cross cradle hold with Baby B, Observed infant with wide open mouth and strong tugging. Mother reports that the latch felt better.  Mother is not active with [facility] yet.she plans to certify.  Discussed engorgement prevention and treatment.  Mother reports that she has lots of support at home. Breastfeed infant with feeding cues Supplement infant with ebm/formula, according to supplemental guidelines. Pump using a DEBP after each feeding for 15-20 mins.   Mother to continue to cue base feed infant and feed at least 8-12 times or more in 24 hours and advised to allow for cluster feeding infant as needed.  Mother to continue to due STS. Mother is aware of available LC services at [facility], BFSG'S, OP Dept, and phone # for questions or concerns about breastfeeding.  Mother receptive to all teaching and plan of care.    Maternal Data    Feeding Mother's Current Feeding Choice: Breast Milk  LATCH Score                    Lactation Tools Discussed/Used    Interventions    Discharge Discharge Education: Engorgement and breast care;Warning signs for feeding baby;Outpatient recommendation  Consult Status Consult Status: Complete    Darla Lesches 06/10/2020, 12:07 PM

## 2020-06-15 LAB — SURGICAL PATHOLOGY

## 2020-06-17 ENCOUNTER — Other Ambulatory Visit: Payer: Self-pay

## 2020-06-17 ENCOUNTER — Ambulatory Visit (INDEPENDENT_AMBULATORY_CARE_PROVIDER_SITE_OTHER): Payer: Medicaid Other | Admitting: *Deleted

## 2020-06-17 VITALS — BP 168/104 | HR 80

## 2020-06-17 DIAGNOSIS — Z98891 History of uterine scar from previous surgery: Secondary | ICD-10-CM

## 2020-06-17 NOTE — Progress Notes (Signed)
Subjective:  Mckenzie Nelson is a 30 y.o. female here for BP and incision check. Pt sates [place]. They did lab work and prescribed procardia which patient hasn't started taken yet.   Hypertension ROS: no TIA's, no chest pain on exertion, no dyspnea on exertion and no swelling of ankles.    Objective:  BP (!) 168/104   Pulse 80   LMP 07/27/2019   Appearance alert, well appearing, and in no distress. General exam BP noted to be poorly controlled today in office.    Assessment:   Blood Pressure needs improvement.   Plan:  Discussed wit Dr Ernestina Patches regarding BP inoffice and ED visit. Pt to start taking procardia and to a BP check with [place] on Monday. Pt has a cuff at home and since she is 2 hours away she will call me with her BP reading.

## 2020-06-17 NOTE — Progress Notes (Signed)
Attestation of Attending Supervision of clinical support staff: I agree with the care provided to this patient and was available for any consultation.  I have reviewed the RN's note and chart. I was available for consult. I reviewed the patients BP and spoke her. She needs to start procardia today and we will schedule BP check in 1 week.   She was given warning s/sx.   Caren Macadam, MD, MPH, ABFM Attending [place] for [facility]

## 2020-07-15 ENCOUNTER — Ambulatory Visit (INDEPENDENT_AMBULATORY_CARE_PROVIDER_SITE_OTHER): Payer: Medicaid Other | Admitting: Family Medicine

## 2020-07-15 ENCOUNTER — Encounter: Payer: Self-pay | Admitting: Family Medicine

## 2020-07-15 DIAGNOSIS — O30043 Twin pregnancy, dichorionic/diamniotic, third trimester: Secondary | ICD-10-CM | POA: Diagnosis not present

## 2020-07-15 DIAGNOSIS — O99013 Anemia complicating pregnancy, third trimester: Secondary | ICD-10-CM

## 2020-07-15 DIAGNOSIS — O0990 Supervision of high risk pregnancy, unspecified, unspecified trimester: Secondary | ICD-10-CM

## 2020-07-15 DIAGNOSIS — Z98891 History of uterine scar from previous surgery: Secondary | ICD-10-CM

## 2020-07-15 DIAGNOSIS — M6208 Separation of muscle (nontraumatic), other site: Secondary | ICD-10-CM

## 2020-07-15 DIAGNOSIS — D509 Iron deficiency anemia, unspecified: Secondary | ICD-10-CM

## 2020-07-15 LAB — CBC
Hematocrit: 35.8 % (ref 34.0–46.6)
Hemoglobin: 12.2 g/dL (ref 11.1–15.9)
MCH: 27.7 pg (ref 26.6–33.0)
MCHC: 34.1 g/dL (ref 31.5–35.7)
MCV: 81 fL (ref 79–97)
Platelets: 227 10*3/uL (ref 150–450)
RBC: 4.4 x10E6/uL (ref 3.77–5.28)
RDW: 18.4 % — ABNORMAL HIGH (ref 11.7–15.4)
WBC: 5.1 10*3/uL (ref 3.4–10.8)

## 2020-07-15 NOTE — Progress Notes (Signed)
[facility] Partum Visit Note  Mckenzie Nelson is a 30 y.o. G52P3004 female who presents for a postpartum visit. She is 5 weeks postpartum following a primary cesarean section.  I have fully reviewed the prenatal and intrapartum course. The delivery was at 38.1 gestational weeks.  Anesthesia: spinal. Postpartum course has been complicated with hypertension. Babies are doing well. Baby is feeding by breast and bottle . Bleeding no bleeding. Bowel function is normal. Bladder function is normal. Patient is not sexually active. Contraception method is oral progesterone-only contraceptive. Postpartum depression screening: negative.   The pregnancy intention screening data noted above was reviewed. Potential methods of contraception were discussed. The patient elected to proceed with Oral Contraceptive.    Edinburgh Postnatal Depression Scale - 07/15/20 1526      Edinburgh Postnatal Depression Scale:  In the Past 7 Days   I have been able to laugh and see the funny side of things. 0    I have looked forward with enjoyment to things. 0    I have blamed myself unnecessarily when things went wrong. 0    I have been anxious or worried for no good reason. 0    I have felt scared or panicky for no good reason. 0    Things have been getting on top of me. 0    I have been so unhappy that I have had difficulty sleeping. 0    I have felt sad or miserable. 0    I have been so unhappy that I have been crying. 0    The thought of harming myself has occurred to me. 0    Edinburgh Postnatal Depression Scale Total 0           Health Maintenance Due  Topic Date Due  . COVID-19 Vaccine (1) Never done  . TETANUS/TDAP  Never done    The following portions of the patient's history were reviewed and updated as appropriate: allergies, current medications, past family history, past medical history, past social history, past surgical history and problem list.  Review of Systems Pertinent items are noted in  HPI.  Objective:  BP (!) 161/107   Pulse 76   Wt 170 lb (77.1 kg)   BMI 29.18 kg/m    General:  alert, cooperative and appears stated age   Breasts:  not indicated  Lungs: clear to auscultation bilaterally  Heart:  regular rate and rhythm, S1, S2 normal, no murmur, click, rub or gallop  Abdomen: soft, non-tender; bowel sounds normal; no masses,  no organomegaly   Wound well approximated incision  GU exam:  not indicated       Assessment:    Normal postpartum exam.   Plan:   Essential components of care per ACOG recommendations:  1.  Mood and well being: Patient with negative depression screening today. Reviewed local resources for support.  - Patient does not use tobacco.  - hx of drug use? No     2. Infant care and feeding:  -Patient currently breastmilk feeding? Yes - discussed return to work and pumping. If needed, patient was provided letter for work to allow for every 2-3 hr pumping breaks, and to be granted a private location to express breastmilk and refrigerated area to store breastmilk. Reviewed importance of draining breast regularly to support lactation. -Social determinants of health (SDOH) reviewed in EPIC. No concerns  3. Sexuality, contraception and birth spacing - Patient does not want a pregnancy in the next year.  Desired family size  is 4 children.  - Reviewed forms of contraception in tiered fashion. Patient desired oral progesterone-only contraceptive today.   - Discussed birth spacing of 18 months  4. Sleep and fatigue -Encouraged family/partner/community support of 4 hrs of uninterrupted sleep to help with mood and fatigue  5. Physical Recovery  - Discussed patients delivery and complications - Patient had a C-section. Perineal healing reviewed. Patient expressed understanding - Patient has urinary incontinence? No  - Diastasis Recti-- poor core-- referral to PT made today - Patient is safe to resume physical and sexual activity  6.  Health  Maintenance - HM due items addressed Yes - Last pap smear done 12/03/2019 and was normal with negative HPV. Pap smear not done at today's visit.  7. Chronic Disease - Elevated BP  - patient has grossly elevated BP but has not taken medication  -reported she was taking procardia but was having low BP at home  - Return in 1 week while on medications - PCP follow up  No future appointments.   Caren Macadam, MD Center for [employer] [facility]

## 2020-08-01 ENCOUNTER — Ambulatory Visit (HOSPITAL_COMMUNITY)
Admission: EM | Admit: 2020-08-01 | Discharge: 2020-08-01 | Disposition: A | Discharge status: Home / Self Care | Payer: Medicaid Other

## 2020-08-01 ENCOUNTER — Encounter (HOSPITAL_COMMUNITY): Payer: Self-pay | Admitting: *Deleted

## 2020-08-01 ENCOUNTER — Other Ambulatory Visit: Payer: Self-pay

## 2020-08-01 DIAGNOSIS — M7918 Myalgia, other site: Secondary | ICD-10-CM

## 2020-08-01 DIAGNOSIS — S301XXA Contusion of abdominal wall, initial encounter: Secondary | ICD-10-CM

## 2020-08-01 NOTE — Discharge Instructions (Signed)
Take Tylenol or ibuprofen as needed for discomfort.  Use cool or warm compresses as needed.  Try gentle massage and stretching.    Go to the emergency department right away if you have abdominal pain or other concerning symptoms.
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
## 2022-05-02 IMAGING — US US OB EACH ADDL GEST<[ID]
1 series · 15 of 28 positions shown · non-contrast
Comparison: None.

CLINICAL DATA: Unknown LMP.

EXAM:
TWIN OBSTETRICAL ULTRASOUND <14 WKS

[Series 1: us ob each addl gest<(id) · 15 of 52 slices shown]
[im 1/52]
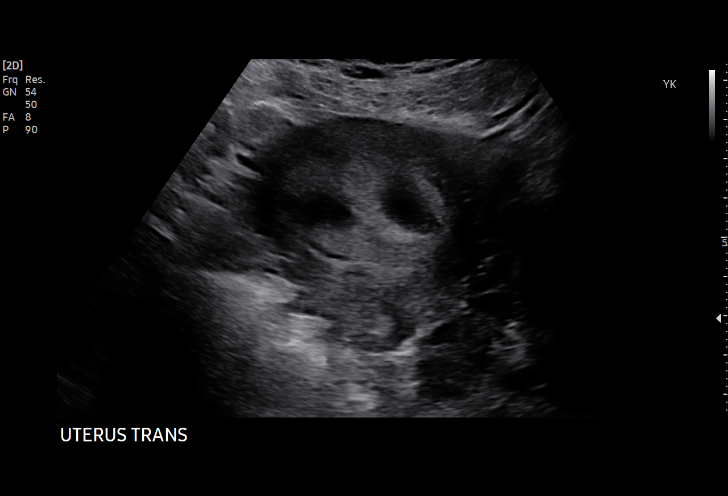
[im 4/52]
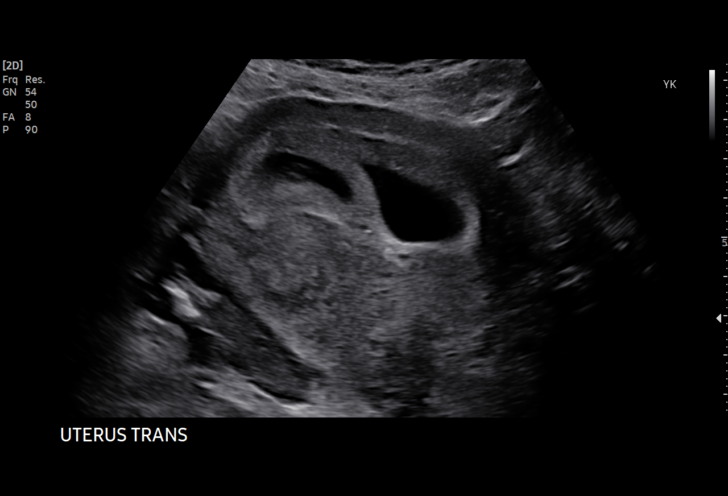
[im 8/52]
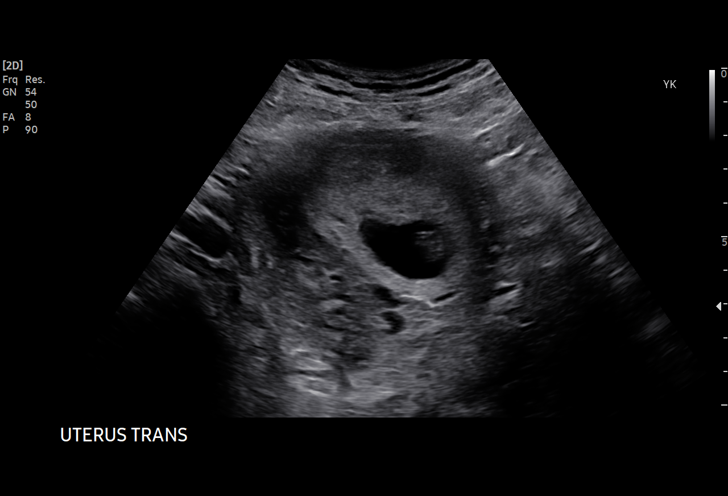
[im 12/52]
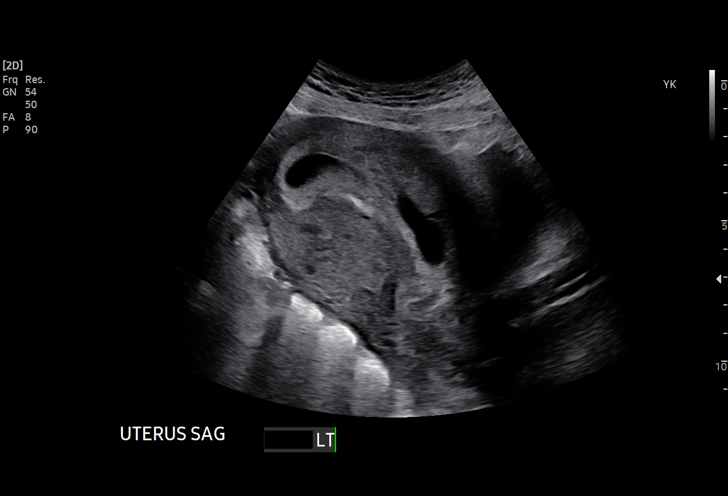
[im 16/52]
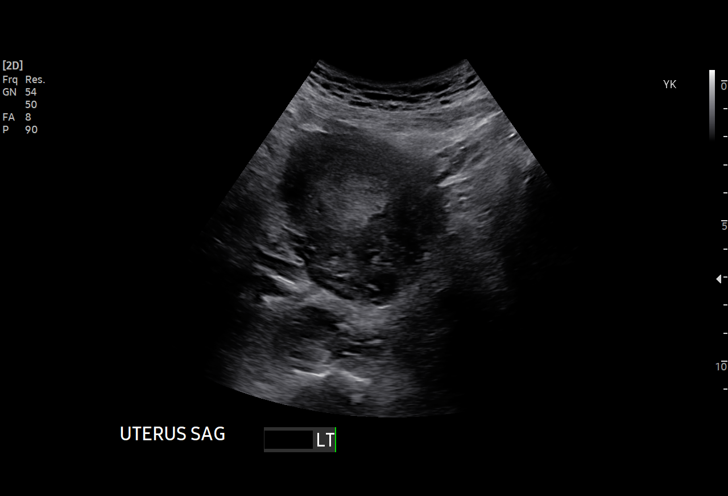
[im 19/52]
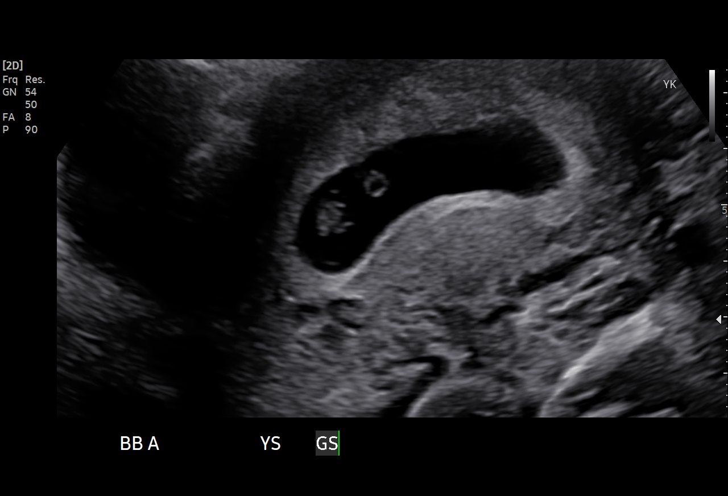
[im 23/52]
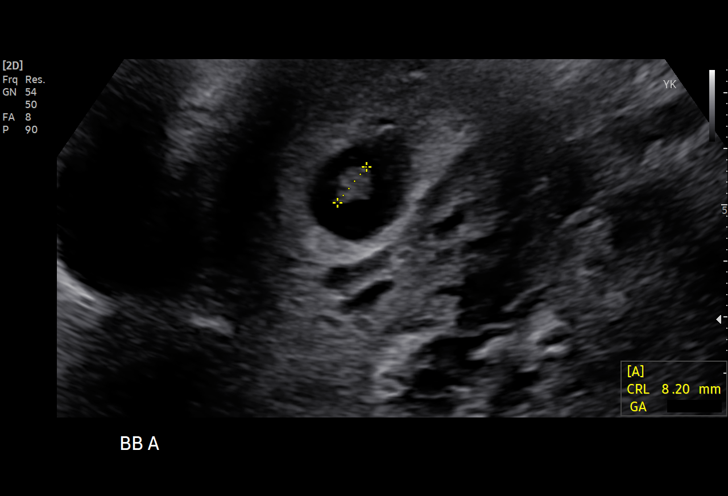
[im 27/52]
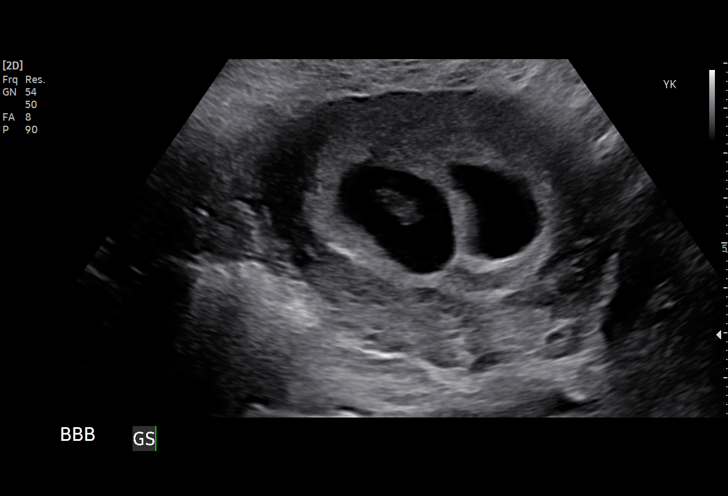
[im 29/52]
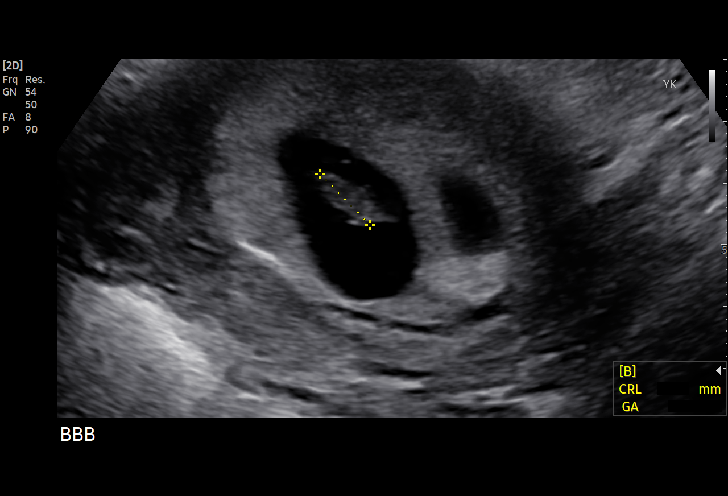
[im 33/52]
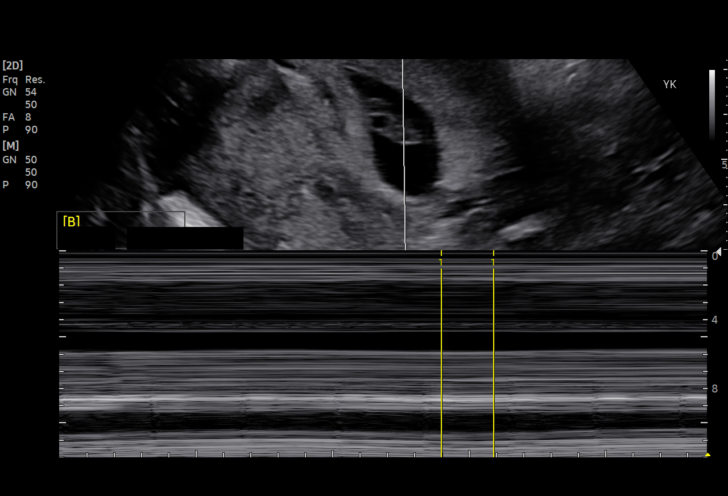
[im 36/52]
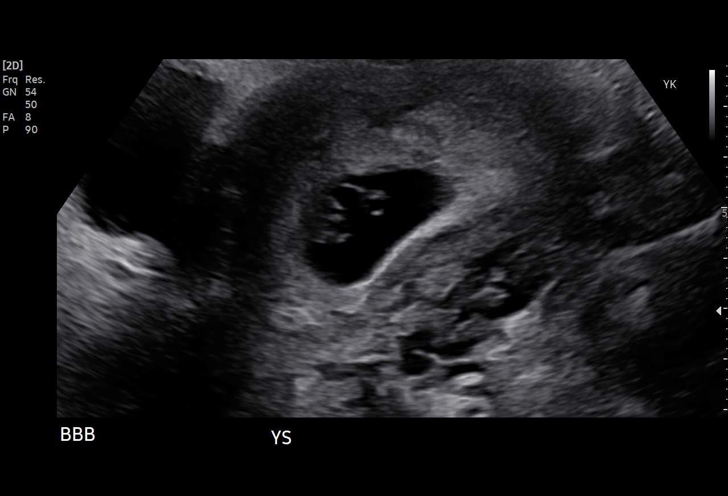
[im 40/52]
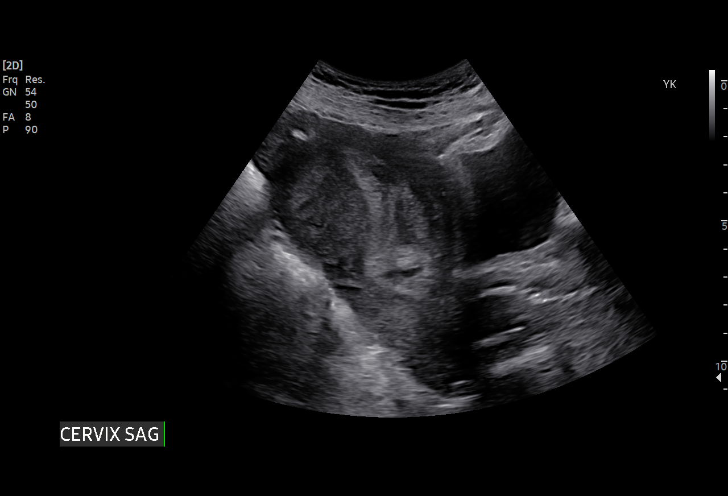
[im 44/52]
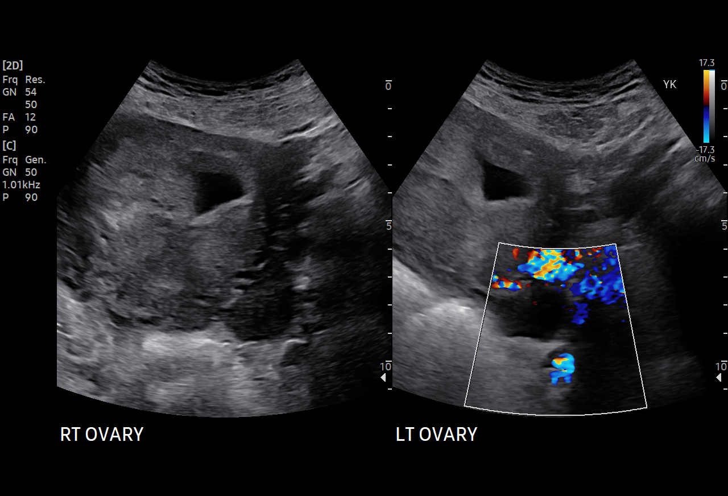
[im 48/52]
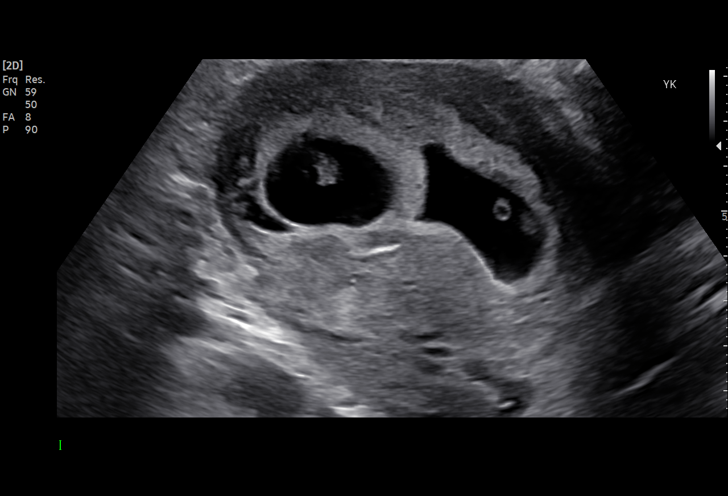
[im 52/52]
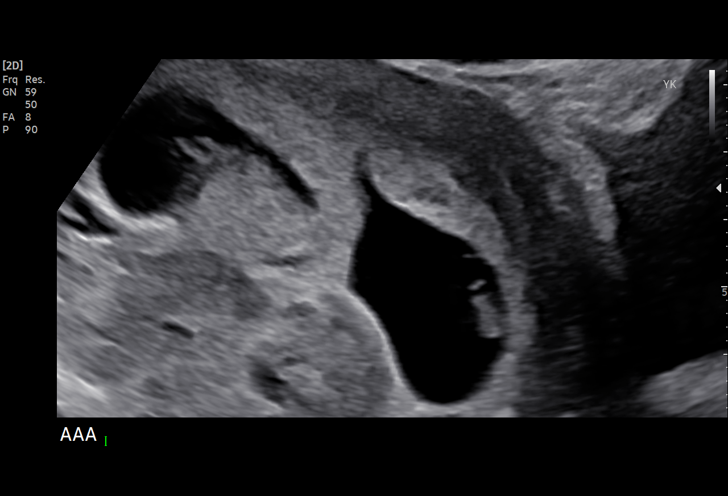

[15 of 28 positions shown; findings below may reference images not displayed]

FINDINGS: Number of IUPs:  2

Chorionicity/Amnionicity:  Dichorionic-diamniotic (thick membrane)

TWIN 1

Yolk sac:  Visualized.

Embryo:  Visualized.

Cardiac Activity: Visualized.

Heart Rate: 158 bpm

CRL:   7 mm   6 w 4 d                  US EDC: 06/26/2020

TWIN 2

Yolk sac:  Visualized.

Embryo:  Visualized.

Cardiac Activity: Visualized.

Heart Rate: 156 bpm

CRL:   12 mm   7 w 2 d                  US EDC: 06/21/2020

Subchorionic hemorrhage: Small amount of subchorionic hemorrhage
noted.

Maternal uterus/adnexae: A small partially calcified uterine fibroid
is seen in the right anterior uterine wall which measures 1.9 cm.
Both ovaries are normal appearance. No adnexal mass or abnormal free
fluid identified.
IMPRESSION: Living dichorionic twin IUP with estimated gestational age of 7
weeks 2 days, and US EDC of 06/21/2020.

Small subchorionic hemorrhage noted.

1.9 cm uterine fibroid.

## 2022-07-25 IMAGING — US US MFM OB DETAIL+14 WK
1 series · 14 of 28 positions shown · non-contrast
Comparison: none

[Series 1: us mfm ob detail+14 wk · 193 acquisitions, 14 frames shown]
[im 8/193]
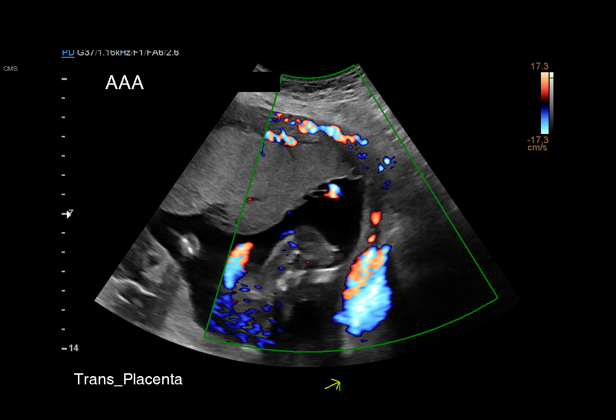
[im 22/193]
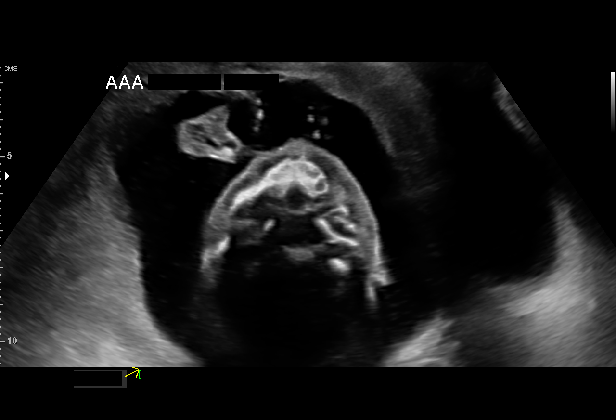
[im 36/193]
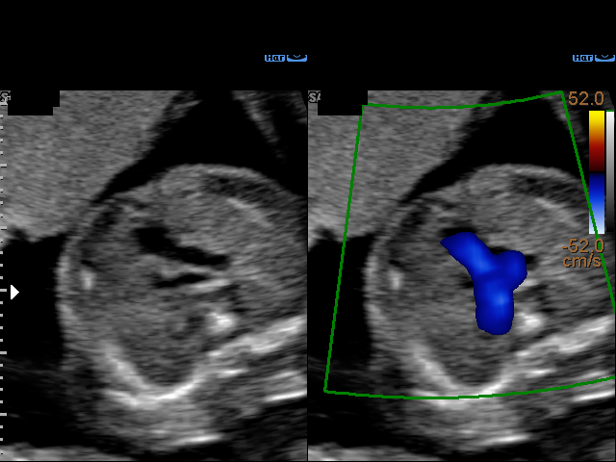
[im 50/193]
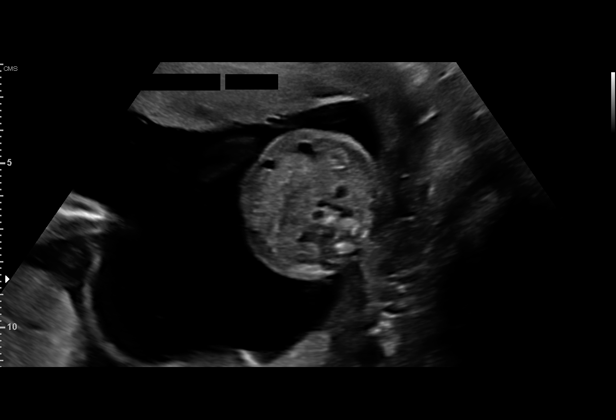
[im 65/193]
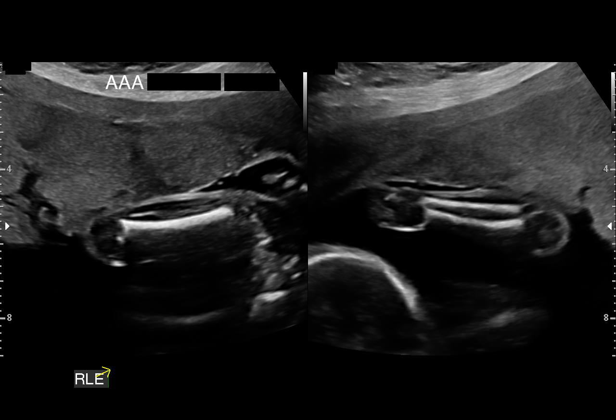
[im 79/193]
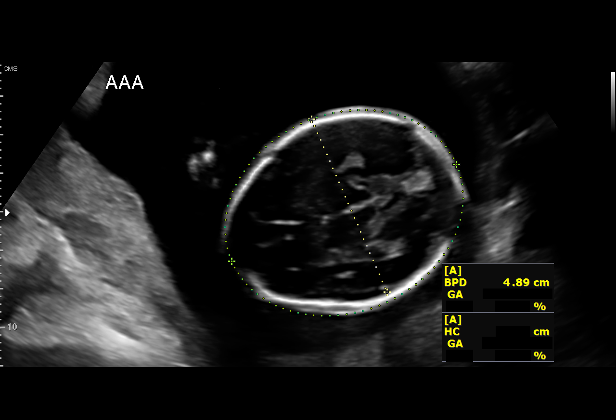
[im 93/193]
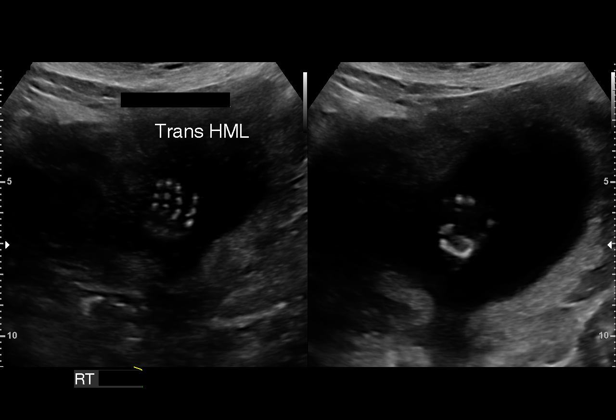
[im 107/193]
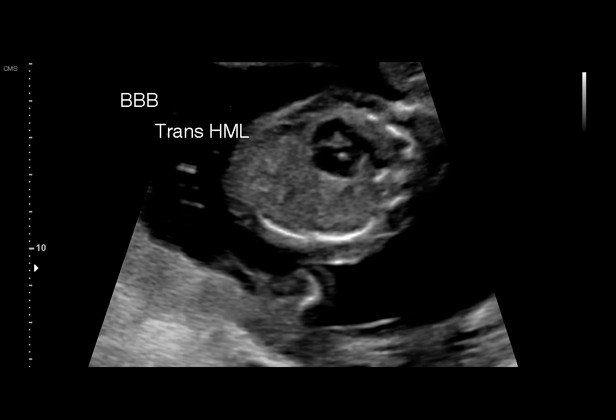
[im 121/193]
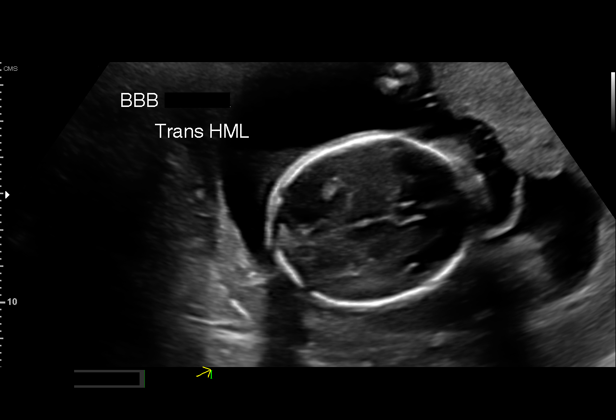
[im 136/193]
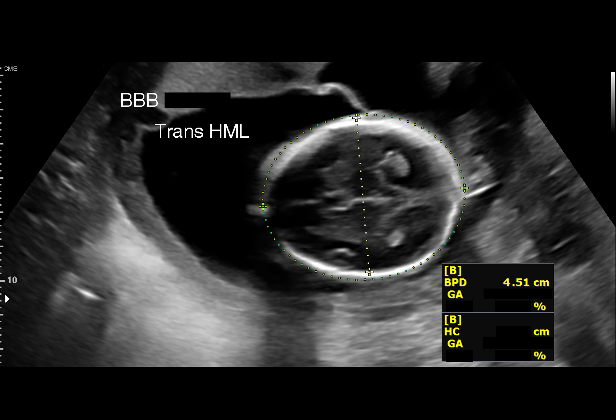
[im 150/193]
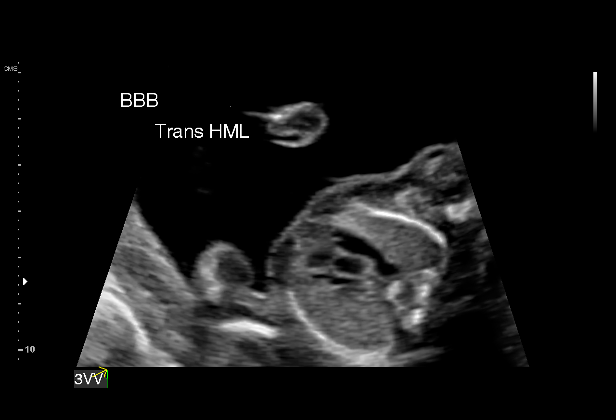
[im 164/193]
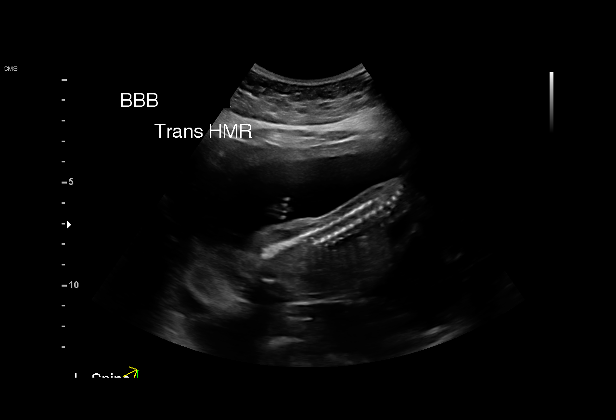
[im 178/193]
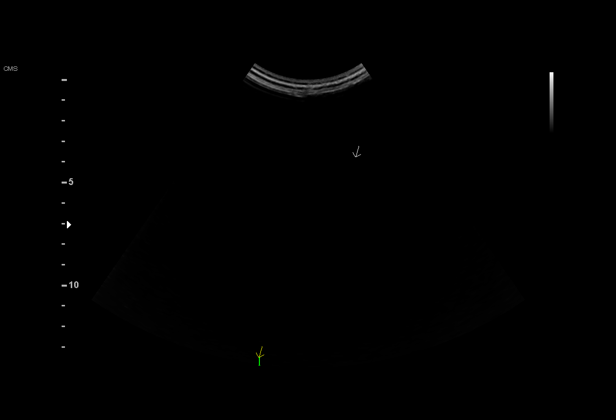
[im 193/193]
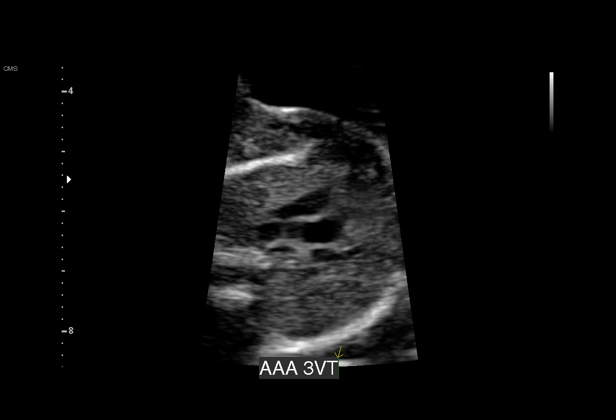

[14 of 28 positions shown; findings below may reference images not displayed]

2  US MFM OB DETAIL ADDL GEST            76811.02    KRITZIA MARIEL REXACCH
    +14 WK

Indications

 19 weeks gestation of pregnancy
 Twin pregnancy, di/di, second trimester
 Hypertension - Chronic/Pre-existing
 (Procardia)
 History of sickle cell trait
Fetal Evaluation (Fetus A)

 Num Of Fetuses:         2
 Fetal Heart Rate(bpm):  158
 Cardiac Activity:       Observed
 Fetal Lie:              Lower Fetus
 Presentation:           Cephalic
 Placenta:               Anterior
 P. Cord Insertion:      Visualized
 Membrane Desc:      Dividing Membrane seen - Dichorionic.

 Amniotic Fluid
 AFI FV:      Within normal limits

                             Largest Pocket(cm)

Biometry (Fetus A)

 BPD:      48.9  mm     G. Age:  20w 6d         95  %    CI:        74.09   %    70 - 86
                                                         FL/HC:      16.4   %    16.1 -
 HC:      180.4  mm     G. Age:  20w 3d         90  %    HC/AC:      1.18        1.09 -
 AC:      152.9  mm     G. Age:  20w 3d         82  %    FL/BPD:     60.5   %
 FL:       29.6  mm     G. Age:  19w 1d         37  %    FL/AC:      19.4   %    20 - 24
 HUM:      29.8  mm     G. Age:  19w 6d         64  %
 CER:      21.4  mm     G. Age:  20w 2d         87  %
 LV:        8.5  mm
 CM:          4  mm

 Est. FW:     323  gm    0 lb 11 oz      83  %     FW Discordancy      0 \ 9 %
OB History

 Gravidity:    3         Term:   2        Prem:   0        SAB:   0
 TOP:          0       Ectopic:  0        Living: 2
Gestational Age (Fetus A)

 LMP:           26w 3d        Date:  07/27/19                 EDD:   05/02/20
 U/S Today:     20w 2d                                        EDD:   06/14/20
 Best:          19w 2d     Det. By:  Early Ultrasound         EDD:   06/21/20
                                     (11/05/19)
Anatomy (Fetus A)

 Cranium:               Appears normal         LVOT:                   Appears normal
 Cavum:                 Appears normal         Aortic Arch:            Appears normal
 Ventricles:            Appears normal         Ductal Arch:            Appears normal
 Choroid Plexus:        Bilateral choroid      Diaphragm:              Appears normal
                        plexus cysts
 Cerebellum:            Appears normal         Stomach:                Appears normal, left
                                                                       sided
 Posterior Fossa:       Appears normal         Abdomen:                Appears normal
 Nuchal Fold:           Appears normal         Abdominal Wall:         Appears nml (cord
                                                                       insert, abd wall)
 Face:                  Appears normal         Cord Vessels:           Appears normal (3
                        (orbits and profile)                           vessel cord)
 Lips:                  Appears normal         Kidneys:                Appear normal
 Palate:                Appears normal         Bladder:                Appears normal
 Thoracic:              Appears normal         Spine:                  Appears normal
 Heart:                 Appears normal         Upper Extremities:      Appears normal
                        (4CH, axis, and
                        situs)
 RVOT:                  Appears normal         Lower Extremities:      Appears normal

 Other:  Parents do not wish to know sex of fetus.Heels/feet and open
         hands/5th digits visualized. Nasal bone visualized. VC, 3VV and
         3VTV visualized.

Fetal Evaluation (Fetus B)

 Num Of Fetuses:         2
 Fetal Heart Rate(bpm):  135
 Cardiac Activity:       Observed
 Fetal Lie:              Upper Fetus
 Presentation:           Variable
 Placenta:               Anterior
 P. Cord Insertion:      Visualized
 Membrane Desc:      Dividing Membrane seen - Dichorionic.
 Amniotic Fluid
 AFI FV:      Within normal limits

                             Largest Pocket(cm)

Biometry (Fetus B)

 BPD:      44.9  mm     G. Age:  19w 4d         63  %    CI:        70.35   %    70 - 86
                                                         FL/HC:      17.2   %    16.1 -
 HC:      170.7  mm     G. Age:  19w 5d         61  %    HC/AC:      1.18        1.09 -
 AC:       145   mm     G. Age:  19w 6d         63  %    FL/BPD:     65.5   %
 FL:       29.4  mm     G. Age:  19w 0d         34  %    FL/AC:      20.3   %    20 - 24
 HUM:      27.9  mm     G. Age:  19w 0d         42  %
 CER:      20.9  mm     G. Age:  20w 0d         77  %
 NFT:       2.5  mm
 LV:          7  mm
 CM:        4.3  mm

 Est. FW:     295  gm    0 lb 10 oz      57  %     FW Discordancy         9  %
Gestational Age (Fetus B)

 LMP:           26w 3d        Date:  07/27/19                 EDD:   05/02/20
 U/S Today:     19w 4d                                        EDD:   06/19/20
 Best:          19w 2d     Det. By:  Early Ultrasound         EDD:   06/21/20
                                     (11/05/19)
Anatomy (Fetus B)

 Cranium:               Appears normal         LVOT:                   Appears normal
 Cavum:                 Appears normal         Aortic Arch:            Appears normal
 Ventricles:            Appears normal         Ductal Arch:            Appears normal
 Choroid Plexus:        Appears normal         Diaphragm:              Appears normal
 Cerebellum:            Appears normal         Stomach:                Appears normal, left
                                                                       sided
 Posterior Fossa:       Appears normal         Abdomen:                Appears normal
 Nuchal Fold:           Appears normal         Abdominal Wall:         Appears nml (cord
                                                                       insert, abd wall)
 Face:                  Appears normal         Cord Vessels:           Appears normal (3
                        (orbits and profile)                           vessel cord)
 Lips:                  Appears normal         Kidneys:                Appear normal
 Palate:                Not well visualized    Bladder:                Appears normal
 Thoracic:              Appears normal         Spine:                  Appears normal
 Heart:                 Appears normal         Upper Extremities:      Appears normal
                        (4CH, axis, and
                        situs)
 RVOT:                  Appears normal         Lower Extremities:      Appears normal

 Other:  Parents do not wish to know sex of fetus. Heels/feet and open
         hands/5th digits visualized. Nasal bone visualized. VC, 3VV
         visualized.
Cervix Uterus Adnexa

 Cervix
 Length:           3.33  cm.
 Normal appearance by transabdominal scan.
 Uterus
 No abnormality visualized.

 Right Ovary
 Within normal limits.

 Left Ovary
 Within normal limits.

 Cul De Sac
 No free fluid seen.

 Adnexa
 No abnormality visualized.
Impression

 G3 P2.  Dichorionic-diamniotic twin pregnancy was
 diagnosed by first trimester ultrasound.  Patient is here for
 fetal anatomy scan.  She opted not to have screening for fetal
 aneuploidies.
 She has chronic hypertension and takes nifedipine XL 30 mg
 daily.  She also takes low-dose aspirin prophylaxis.  Patient
 has anemia.

 Obstetric history significant for 2 previous term vaginal
 deliveries.

 Twin A: Lower fetus, cephalic presentation, anterior placenta,
 female fetus.  Fetal biometry is consistent with her previously
 established dates.  Bilateral choroid plexus cysts (CPC) were
 seen.  No other markers of aneuploidies or fetal structural
 defects are seen.  Amniotic fluid is normal and good fetal
 activity seen.

 Twin B: Upper fetus, variable presentation, anterior placenta,
 female fetus.  Fetal biometry is consistent with her previously
 established dates.  No markers of aneuploidies or fetal
 structural defects are seen.  Amniotic fluid is normal and
 good fetal activity seen.

 Growth discordance: 9% (normal).

 CPC: I counseled the patient that isolated CPC is only rarely
 associated with chromosomal anomaly (trisomy 18). I also
 reassured her that CPC is not associated with structural
 malformations in the brain. CPCs usually resolve with
 advancing gestation.
 I discussed the option of cell free fetal DNA screening that
 detects trisomies 21, 18, 13 and sex chromosomal
 anomalies.  I also informed her that only amniocentesis will
 give a definitive result on the fetal karyotype.

 Patient understands the limitations of ultrasound in detecting
 fetal anomalies and opted not to have amniocentesis.

 Her blood pressures today at our office were 142/85 and
 143/77 and repeat 145/90 mmHg her pulse rate at discharge
 was 120/minute.  She does not have symptoms of headache
 or visual disturbances or right upper quadrant pain.  I advised
 the patient to come to the hospital if she has symptoms of
 dizziness or palpitation or headache or visual disturbances.
Recommendations

 -An appointment was made for her to return in 4 weeks for
 fetal growth assessment.
                 [place]

## 2022-08-22 IMAGING — US US MFM OB FOLLOW-UP
1 series · 12 of 28 positions shown · non-contrast
Comparison: none

[Series 1: us mfm ob follow-up · 12 of 69 slices shown]
[im 3/69]
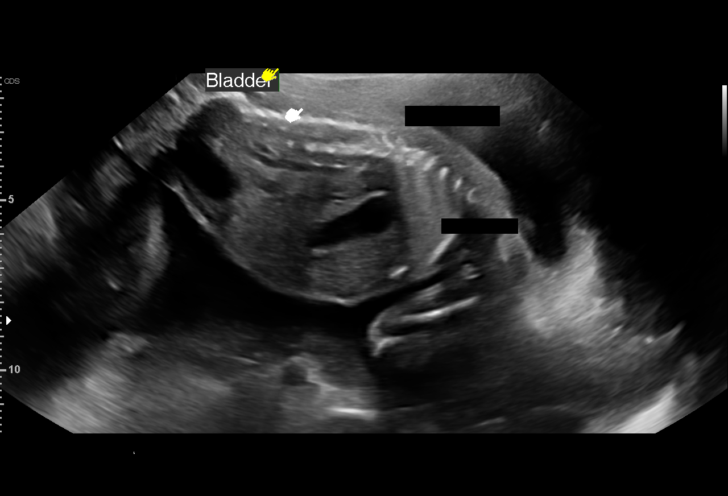
[im 8/69]
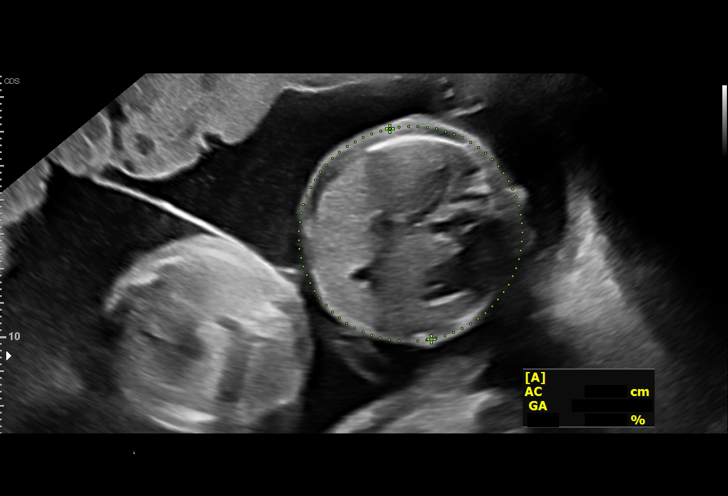
[im 13/69]
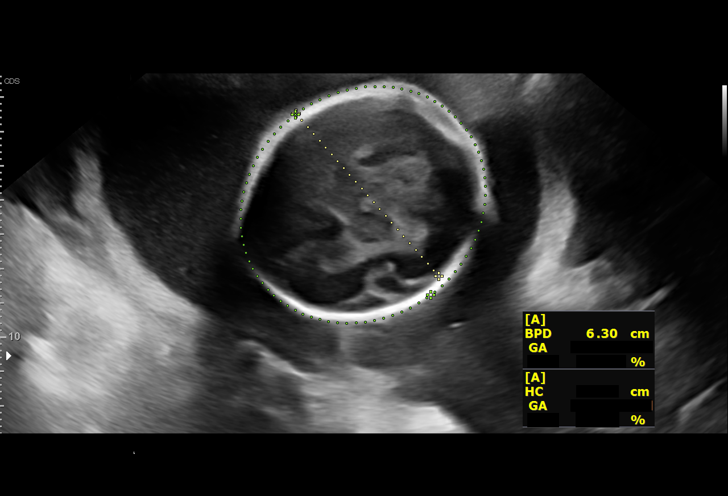
[im 21/69]
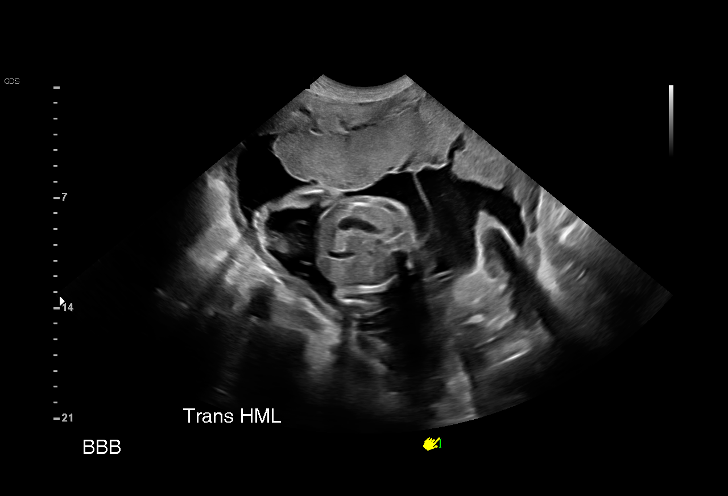
[im 26/69]
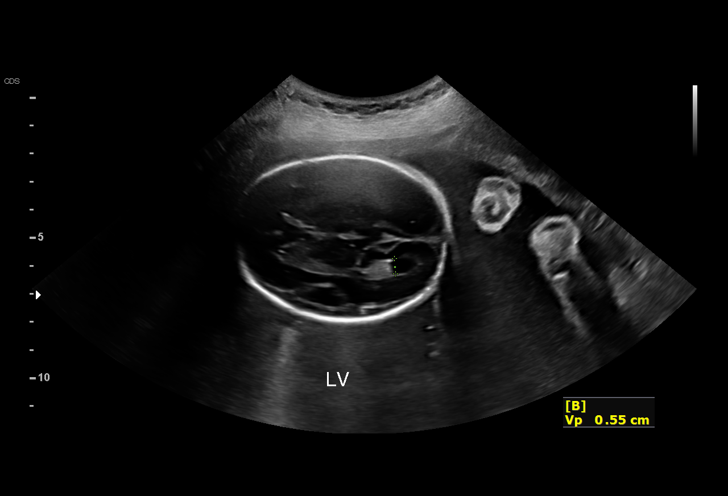
[im 31/69]
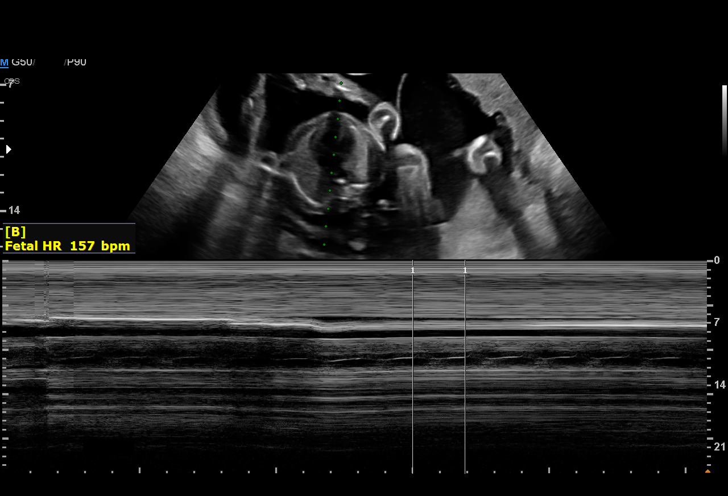
[im 38/69]
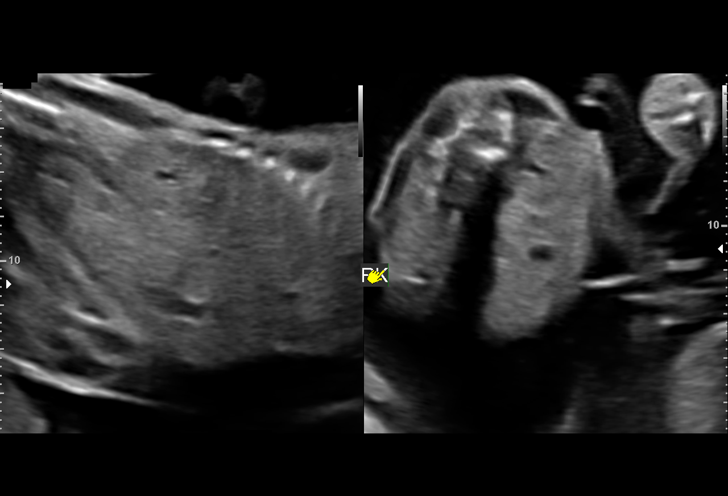
[im 43/69]
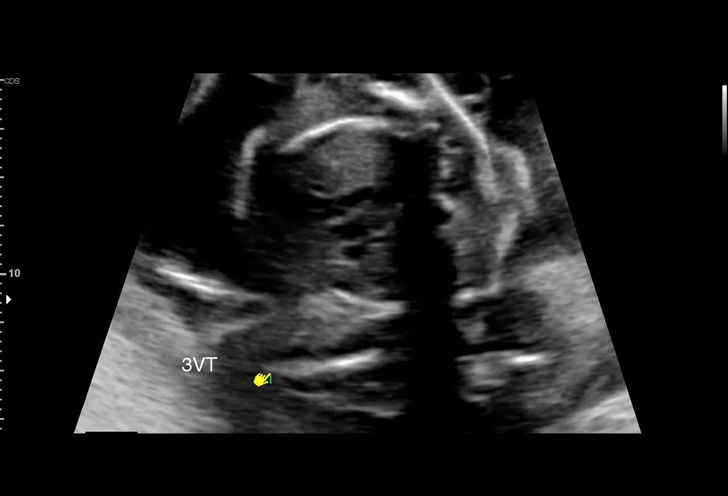
[im 48/69]
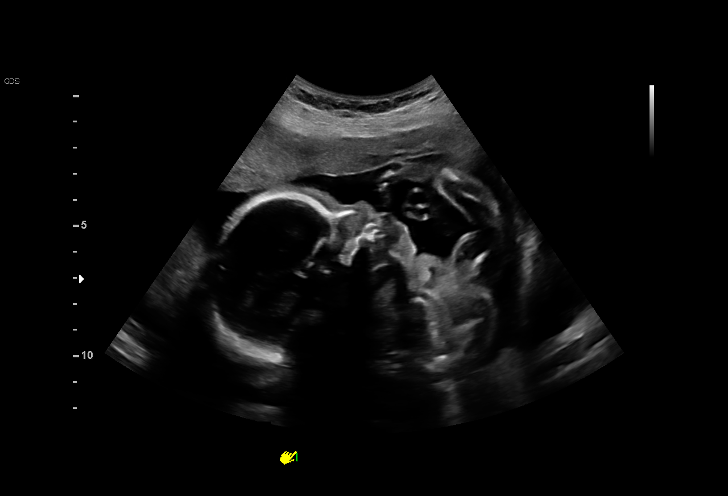
[im 56/69]
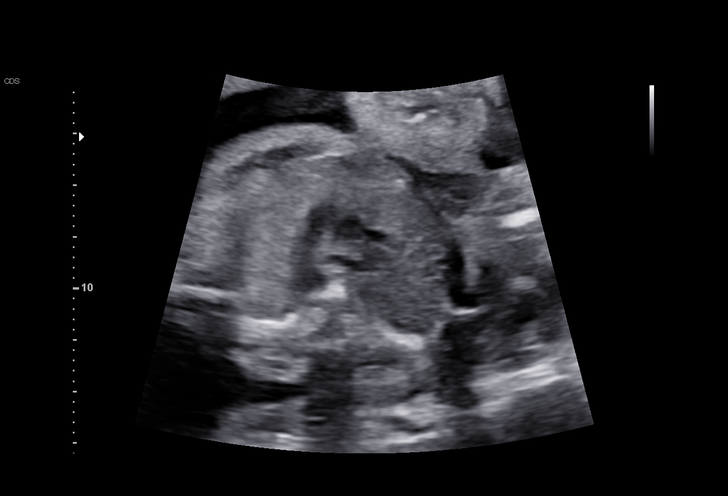
[im 61/69]
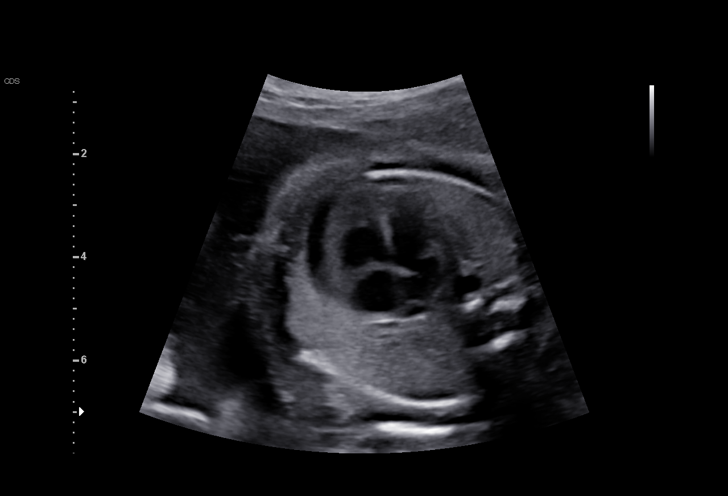
[im 66/69]
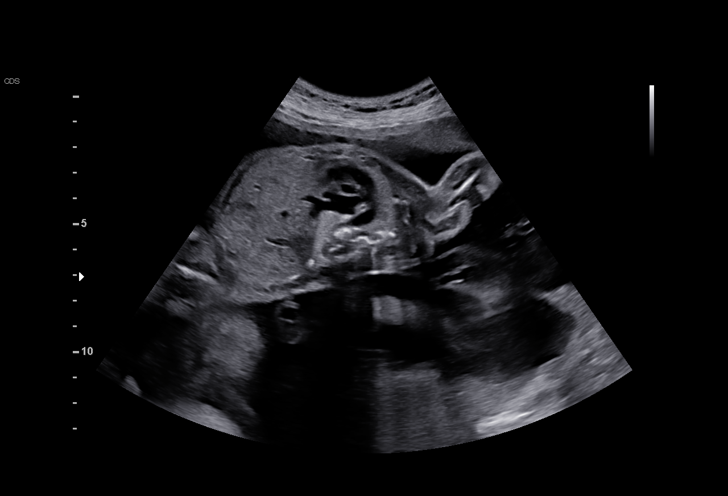

[12 of 28 positions shown; findings below may reference images not displayed]

GEST

Indications

 Twin pregnancy, di/di, second trimester
 Hypertension - Chronic/Pre-existing
 (Procardia)
 23 weeks gestation of pregnancy
 History of sickle cell trait
Fetal Evaluation (Fetus A)

 Num Of Fetuses:         2
 Fetal Heart Rate(bpm):  138
 Cardiac Activity:       Observed
 Presentation:           Cephalic/LEFT/inferior
 Placenta:               Anterior

 Amniotic Fluid
 AFI FV:      Within normal limits

                             Largest Pocket(cm)

Biometry (Fetus A)

 BPD:      62.9  mm     G. Age:  25w 3d         98  %    CI:        77.98   %    70 - 86
                                                         FL/HC:      18.7   %    19.2 -
 HC:      225.4  mm     G. Age:  24w 4d         82  %    HC/AC:      1.15        1.05 -
 AC:       196   mm     G. Age:  24w 2d         73  %    FL/BPD:     66.9   %    71 - 87
 FL:       42.1  mm     G. Age:  23w 5d         53  %    FL/AC:      21.5   %    20 - 24
 LV:        7.3  mm
 Est. FW:     667  gm      1 lb 8 oz     83  %     FW Discordancy     0 \ 14 %
OB History

 Gravidity:    3         Term:   2        Prem:   0        SAB:   0
 TOP:          0       Ectopic:  0        Living: 2
Gestational Age (Fetus A)

 LMP:           30w 3d        Date:  07/27/19                 EDD:   05/02/20
 U/S Today:     24w 4d                                        EDD:   06/12/20
 Best:          23w 2d     Det. By:  Early Ultrasound         EDD:   06/21/20
                                     (11/05/19)
Anatomy (Fetus A)

 Cranium:               Appears normal         LVOT:                   Previously seen
 Cavum:                 Previously seen        Aortic Arch:            Previously seen
 Ventricles:            Appears normal         Ductal Arch:            Previously seen
 Choroid Plexus:        Appears normal         Diaphragm:              Appears normal
 Cerebellum:            Appears normal         Stomach:                Appears normal, left
                                                                       sided
 Posterior Fossa:       Previously seen        Abdomen:                Appears normal
 Nuchal Fold:           Not applicable (>20    Abdominal Wall:         Previously seen
                        wks GA)
 Face:                  Orbits and profile     Cord Vessels:           Previously seen
                        previously seen
 Lips:                  Previously seen        Kidneys:                Appear normal
 Palate:                Appears normal         Bladder:                Appears normal
 Thoracic:              Previously seen        Spine:                  Previously seen
 Heart:                 Appears normal         Upper Extremities:      Previously seen
                        (4CH, axis, and
                        situs)
 RVOT:                  Previously seen        Lower Extremities:      Previously seen

 Other:  Parents do not wish to know sex of fetus.Heels/feet and open
         hands/5th digits visualized. Nasal bone visualized. VC, 3VV and
         3VTV visualized.

Fetal Evaluation (Fetus B)

 Num Of Fetuses:         2
 Fetal Heart Rate(bpm):  157
 Cardiac Activity:       Observed
 Presentation:           Transverse, head to maternal left
 Placenta:               Anterior

 Amniotic Fluid
 AFI FV:      Within normal limits

                             Largest Pocket(cm)

Biometry (Fetus B)

 BPD:      58.8  mm     G. Age:  24w 0d         73  %    CI:        71.91   %    70 - 86
                                                         FL/HC:      17.9   %    19.2 -
 HC:      220.7  mm     G. Age:  24w 1d         67  %    HC/AC:      1.19        1.05 -
 AC:       186   mm     G. Age:  23w 3d         45  %    FL/BPD:     67.2   %    71 - 87
 FL:       39.5  mm     G. Age:  22w 5d         21  %    FL/AC:      21.2   %    20 - 24

 LV:        5.5  mm
 Est. FW:     575  gm      1 lb 4 oz     39  %     FW Discordancy        14  %
Gestational Age (Fetus B)

 LMP:           30w 3d        Date:  07/27/19                 EDD:   05/02/20
 U/S Today:     23w 4d                                        EDD:   06/19/20
 Best:          23w 2d     Det. By:  Early Ultrasound         EDD:   06/21/20
                                     (11/05/19)
Anatomy (Fetus B)

 Cranium:               Appears normal         LVOT:                   Previously seen
 Cavum:                 Previously seen        Aortic Arch:            Previously seen
 Ventricles:            Appears normal         Ductal Arch:            Previously seen
 Choroid Plexus:        Appears normal         Diaphragm:              Appears normal
 Cerebellum:            Previously seen        Stomach:                Appears normal, left
                                                                       sided
 Posterior Fossa:       Previously seen        Abdomen:                Previously seen
 Nuchal Fold:           Not applicable (>20    Abdominal Wall:         Previously seen
                        wks GA)
 Face:                  Orbits and profile     Cord Vessels:           Previously seen
                        previously seen
 Lips:                  Previously seen        Kidneys:                Appear normal
 Palate:                Appears normal         Bladder:                Appears normal
 Thoracic:              Previously seen        Spine:                  Previously seen
 Heart:                 Appears normal         Upper Extremities:      Previously seen
                        (4CH, axis, and
                        situs)
 RVOT:                  Previously seen        Lower Extremities:      Previously seen

 Other:  Parents do not wish to know sex of fetus. Heels/feet and open
         hands/5th digits visualized. Nasal bone visualized. VC, 3VV
         visualized.
Cervix Uterus Adnexa

 Cervix
 Length:           3.61  cm.
 Normal appearance by transabdominal scan.

 Uterus
 No abnormality visualized.

 Right Ovary
 No adnexal mass visualized.

 Left Ovary
 No adnexal mass visualized.

 Cul De Sac
 No free fluid seen.

 Adnexa
 No abnormality visualized.
Comments

 This patient was seen for a follow up growth scan due to a
 dichorionic, diamniotic twin gestation.  Her pregnancy has
 also been complicated by chronic hypertension currently
 treated with Procardia.  She denies any problems since her
 last exam.
 She was informed that the fetal growth and amniotic fluid
 level appears appropriate for her gestational age for both twin
 A and twin B.
 The previously noted bilateral choroid plexus cysts in twin A
 have resolved.
 A follow up exam was scheduled in 4 weeks.

## 2022-10-31 IMAGING — US US MFM FETAL BPP W/O NON-STRESS
1 series · 14 of 27 positions shown · non-contrast
Comparison: none

[Series 1: us mfm fetal bpp w/o non-stress · 27 acquisitions, 14 frames shown]
[im 1/27]
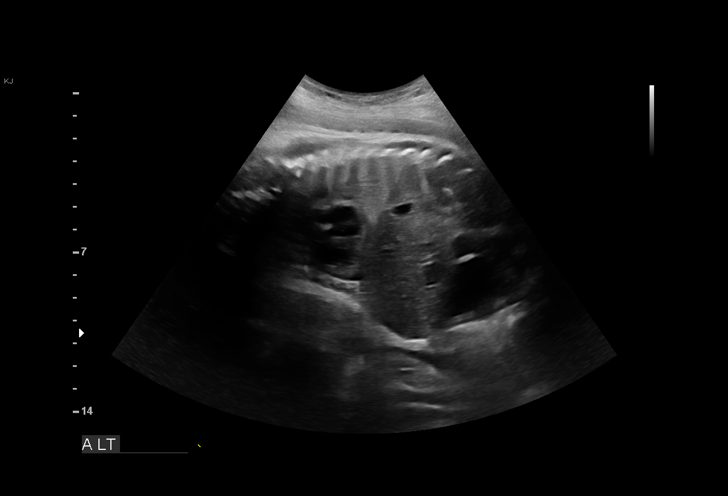
[im 3/27]
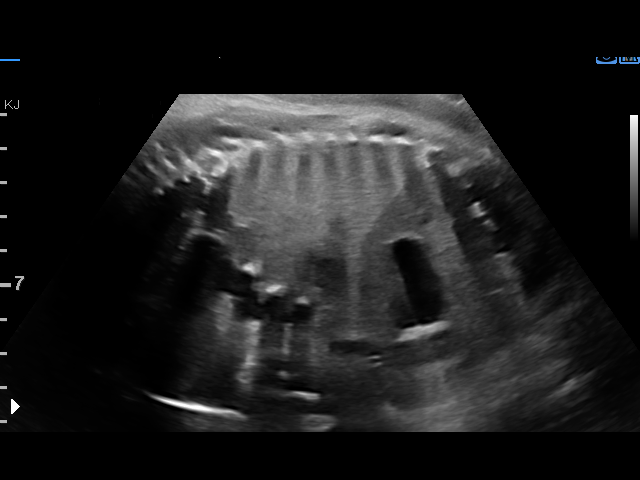
[im 5/27]
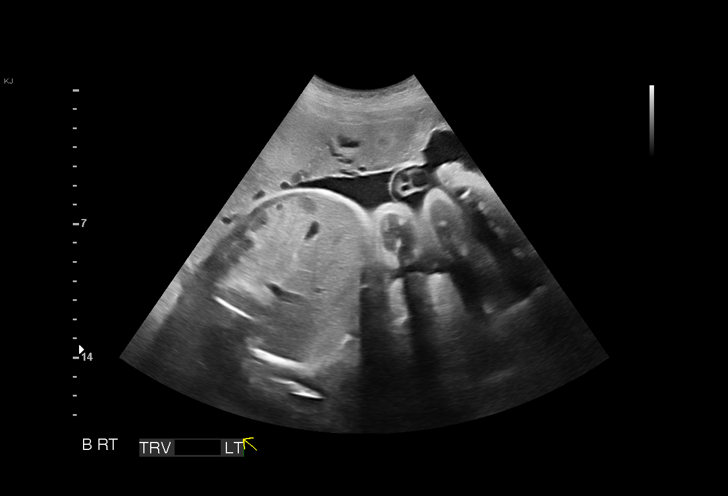
[im 7/27]
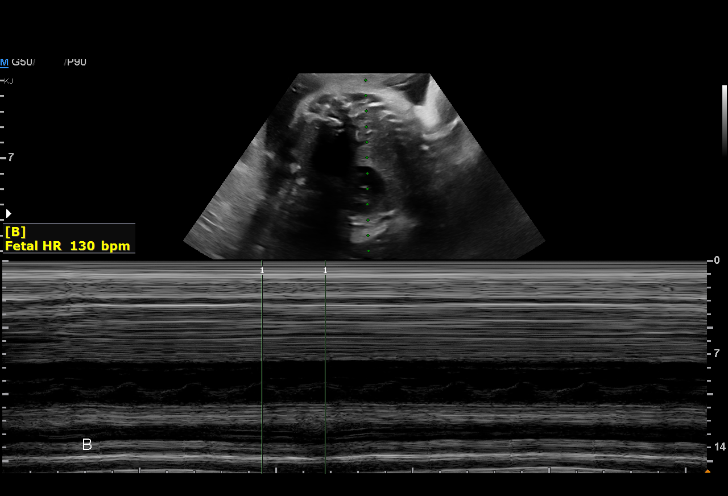
[im 9/27]
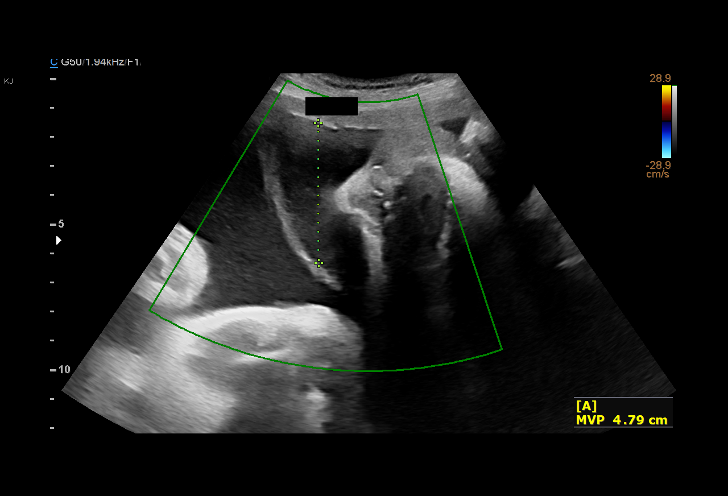
[im 11/27]
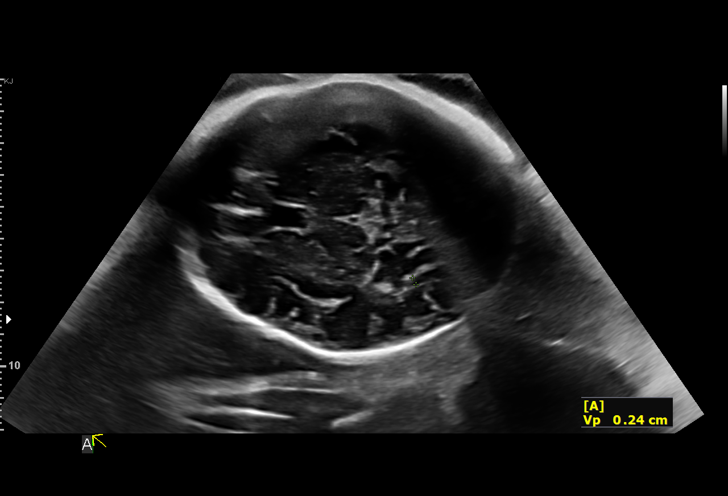
[im 13/27]
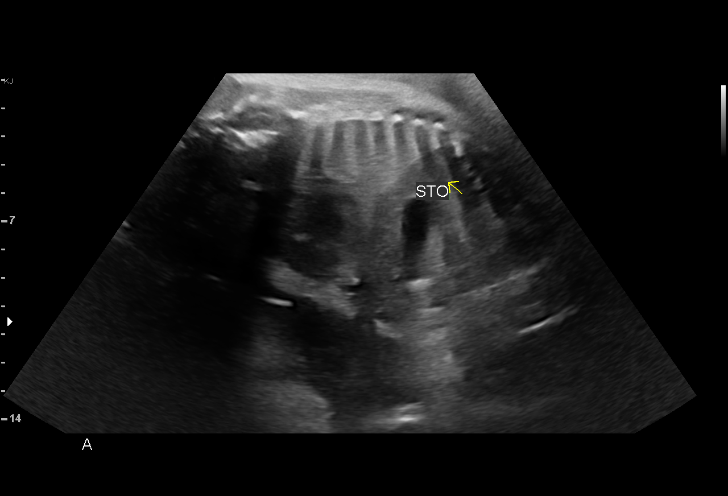
[im 15/27]
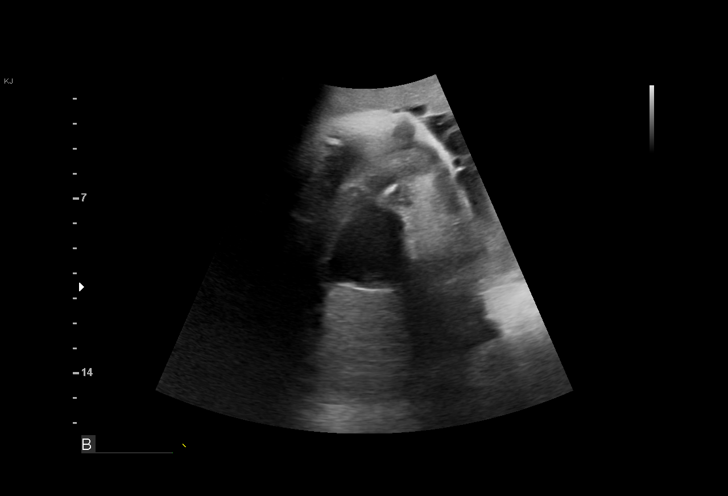
[im 17/27]
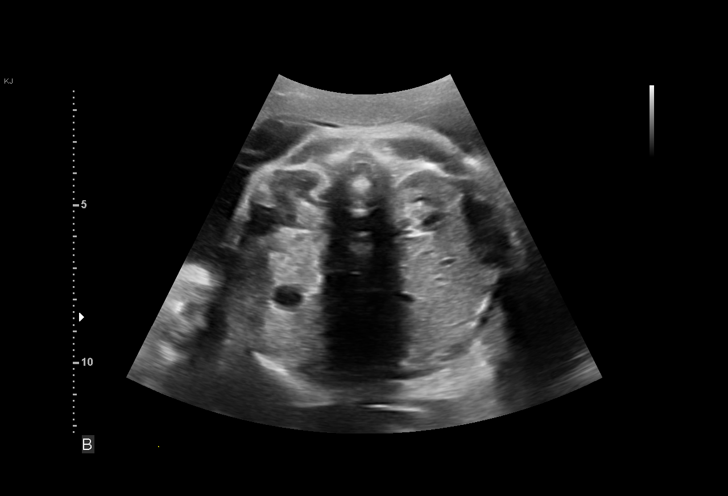
[im 19/27]
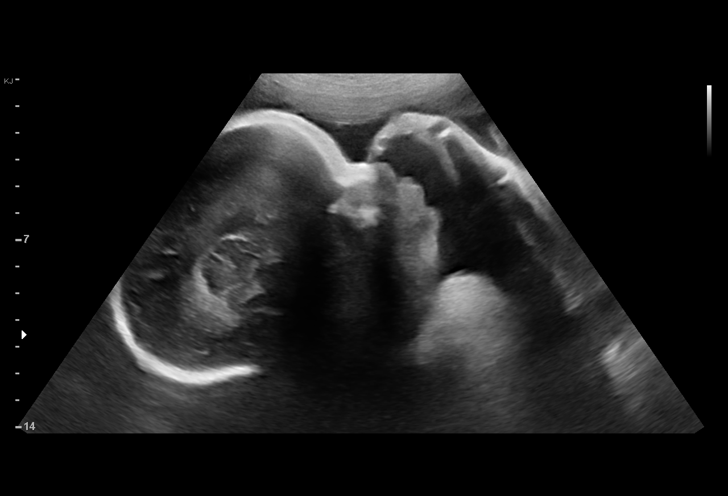
[im 21/27]
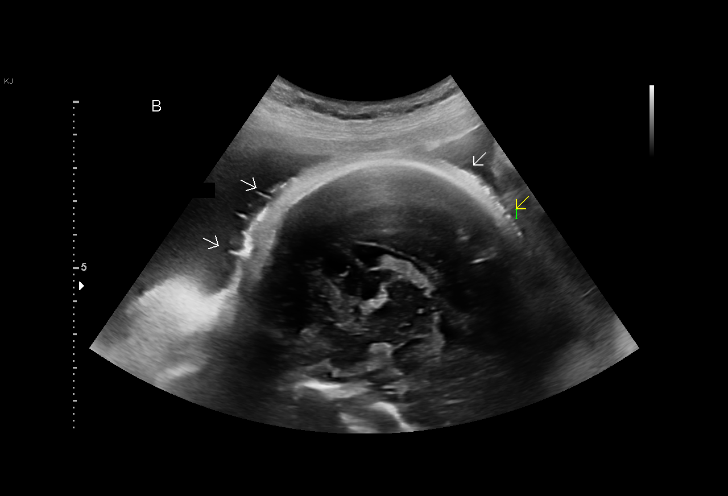
[im 23/27]
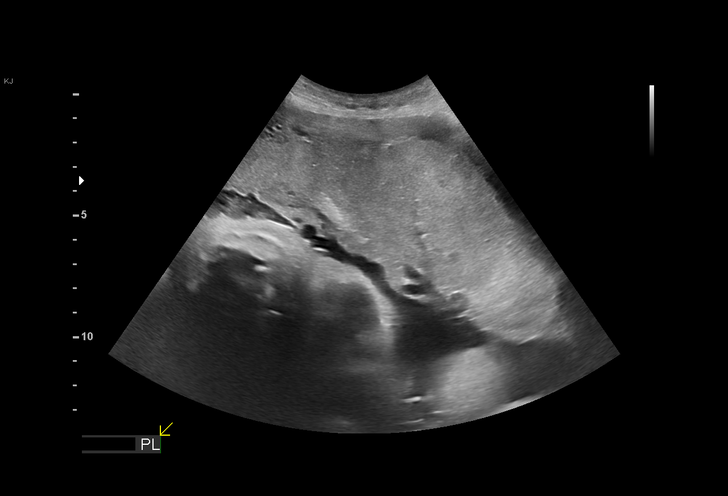
[im 25/27]
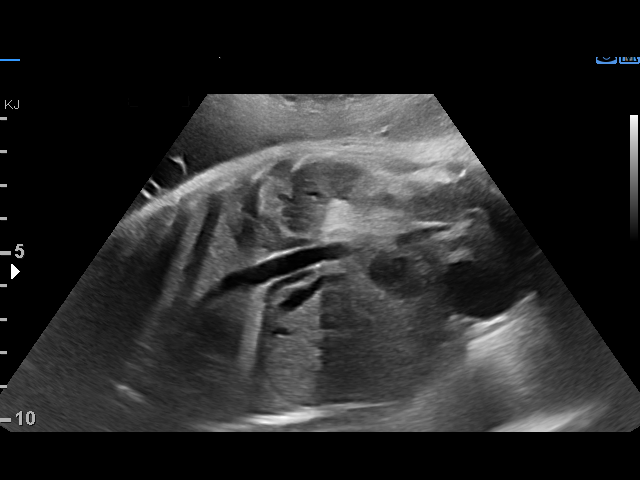
[im 27/27]
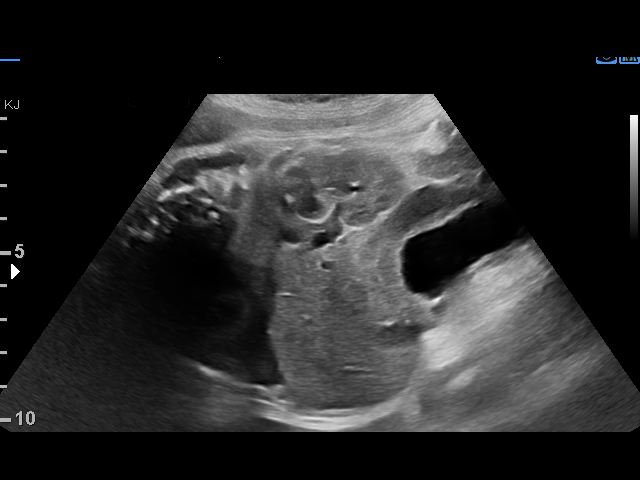

[14 of 27 positions shown; findings below may reference images not displayed]

ADDL GESTATION

Indications

 33 weeks gestation of pregnancy
 Hypertension - Chronic/Pre-existing
 (Procardia)
 Encounter for other antenatal screening
 follow-up
 History of sickle cell trait
 Twin pregnancy, di/di, third trimester
Fetal Evaluation (Fetus A)

 Num Of Fetuses:         2
 Fetal Heart Rate(bpm):  131
 Cardiac Activity:       Observed
 Fetal Lie:              Maternal left side; lower fetus
 Presentation:           Breech
 Placenta:               Anterior

 Amniotic Fluid
 AFI FV:      Within normal limits

                             Largest Pocket(cm)

Biophysical Evaluation (Fetus A)
 Amniotic F.V:   Within normal limits       F. Tone:        Observed
 F. Movement:    Observed                   Score:          [DATE]
 F. Breathing:   Observed
Biometry (Fetus A)

 LV:        2.4  mm
OB History

 Gravidity:    3         Term:   2        Prem:   0        SAB:   0
 TOP:          0       Ectopic:  0        Living: 2
Gestational Age (Fetus A)

 LMP:           40w 3d        Date:  07/27/19                 EDD:   05/02/20
 Best:          33w 2d     Det. By:  Early Ultrasound         EDD:   06/21/20
                                     (11/05/19)
Anatomy (Fetus A)

 Ventricles:            Appears normal         Bladder:                Appears normal
 Stomach:               Appears normal, left
                        sided

 Other:  All other anatomy complete, previously seen as normal.

Fetal Evaluation (Fetus B)

 Num Of Fetuses:         2
 Fetal Heart Rate(bpm):  130
 Cardiac Activity:       Observed
 Fetal Lie:              Upper Fetus; maternal right
 Presentation:           Transverse, head to maternal left
 Placenta:               Anterior

 Amniotic Fluid
 AFI FV:      Within normal limits

                             Largest Pocket(cm)

Biophysical Evaluation (Fetus B)

 Amniotic F.V:   Within normal limits       F. Tone:        Observed
 F. Movement:    Observed                   Score:          [DATE]
 F. Breathing:   Observed
Gestational Age (Fetus B)

 LMP:           40w 3d        Date:  07/27/19                 EDD:   05/02/20
 Best:          33w 2d     Det. By:  Early Ultrasound         EDD:   06/21/20
                                     (11/05/19)
Anatomy (Fetus B)
 Palate:                Not well visualized    Kidneys:                Appear normal
 Stomach:               Appears normal, left   Bladder:                Appears normal
                        sided

 Other:  All other anatomy complete, previously seen as normal.
Impression

 Dichorionic-diamniotic twin pregnancy.
 Chronic hypertension.  Well controlled on Procardia.  Blood
 pressure today at her office is 131/75 mmHg.

 Twin A: Lower fetus, maternal left, breech presentation,
 anterior placenta.  Amniotic fluid is normal and good fetal
 activity seen.Antenatal testing is reassuring. BPP [DATE].

 Twin B: Upper fetus, maternal right, transverse lie and head
 to maternal left, anterior placenta.Amniotic fluid is normal and
 good fetal activity is seen .Antenatal testing is reassuring.
 BPP [DATE].

 We reassured the patient of the findings.
Recommendations

 -Continue weekly BPP till delivery.
                 Garibaldi, Narayanan
